# Patient Record
Sex: Female | Born: 1985 | Marital: Single | Smoking: Never smoker
Health system: Southern US, Community
[De-identification: ages and names within clinical notes are randomized; demographics above are authoritative.]

## PROBLEM LIST (undated history)

## (undated) DIAGNOSIS — F329 Major depressive disorder, single episode, unspecified: Secondary | ICD-10-CM

## (undated) DIAGNOSIS — F32A Depression, unspecified: Secondary | ICD-10-CM

## (undated) DIAGNOSIS — F419 Anxiety disorder, unspecified: Secondary | ICD-10-CM

## (undated) DIAGNOSIS — I10 Essential (primary) hypertension: Secondary | ICD-10-CM

## (undated) HISTORY — PX: NO PAST SURGERIES: SHX2092

---
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
## 2007-12-06 ENCOUNTER — Emergency Department (HOSPITAL_COMMUNITY): Admission: EM | Admit: 2007-12-06 | Discharge: 2007-12-06 | Payer: Self-pay | Admitting: Emergency Medicine

## 2008-06-05 ENCOUNTER — Emergency Department (HOSPITAL_COMMUNITY): Admission: EM | Admit: 2008-06-05 | Discharge: 2008-06-05 | Payer: Self-pay | Admitting: Emergency Medicine

## 2008-07-02 ENCOUNTER — Emergency Department (HOSPITAL_COMMUNITY): Admission: EM | Admit: 2008-07-02 | Discharge: 2008-07-02 | Payer: Self-pay | Admitting: Emergency Medicine

## 2009-06-03 ENCOUNTER — Inpatient Hospital Stay (HOSPITAL_COMMUNITY): Admission: AD | Admit: 2009-06-03 | Discharge: 2009-06-03 | Payer: Self-pay | Admitting: Obstetrics & Gynecology

## 2009-06-03 ENCOUNTER — Ambulatory Visit: Payer: Self-pay | Admitting: Advanced Practice Midwife

## 2009-06-27 ENCOUNTER — Inpatient Hospital Stay (HOSPITAL_COMMUNITY): Admission: AD | Admit: 2009-06-27 | Discharge: 2009-06-29 | Payer: Self-pay | Admitting: Obstetrics & Gynecology

## 2009-06-27 ENCOUNTER — Ambulatory Visit: Payer: Self-pay | Admitting: Obstetrics and Gynecology

## 2010-05-23 LAB — CBC
HCT: 31.8 % — ABNORMAL LOW (ref 36.0–46.0)
HCT: 37 % (ref 36.0–46.0)
Hemoglobin: 10.6 g/dL — ABNORMAL LOW (ref 12.0–15.0)
Hemoglobin: 12.4 g/dL (ref 12.0–15.0)
MCHC: 33.7 g/dL (ref 30.0–36.0)
MCV: 98.6 fL (ref 78.0–100.0)
Platelets: 175 10*3/uL (ref 150–400)
Platelets: 199 10*3/uL (ref 150–400)
RDW: 13.5 % (ref 11.5–15.5)
WBC: 18.6 10*3/uL — ABNORMAL HIGH (ref 4.0–10.5)

## 2010-05-23 LAB — RAPID URINE DRUG SCREEN, HOSP PERFORMED
Amphetamines: NOT DETECTED
Barbiturates: NOT DETECTED
Cocaine: NOT DETECTED
Opiates: NOT DETECTED
Tetrahydrocannabinol: POSITIVE — AB

## 2010-05-23 LAB — RPR: RPR Ser Ql: NONREACTIVE

## 2010-05-24 LAB — URINALYSIS, ROUTINE W REFLEX MICROSCOPIC
Bilirubin Urine: NEGATIVE
Hgb urine dipstick: NEGATIVE
Specific Gravity, Urine: 1.025 (ref 1.005–1.030)
pH: 6 (ref 5.0–8.0)

## 2010-06-14 LAB — POCT I-STAT, CHEM 8
Calcium, Ion: 1.2 mmol/L (ref 1.12–1.32)
Creatinine, Ser: 1.2 mg/dL (ref 0.4–1.2)
Potassium: 3.1 mEq/L — ABNORMAL LOW (ref 3.5–5.1)
Sodium: 144 mEq/L (ref 135–145)
TCO2: 21 mmol/L (ref 0–100)

## 2010-06-14 LAB — RAPID URINE DRUG SCREEN, HOSP PERFORMED: Amphetamines: NOT DETECTED

## 2012-07-23 ENCOUNTER — Encounter (HOSPITAL_COMMUNITY): Payer: Self-pay | Admitting: Emergency Medicine

## 2012-07-23 ENCOUNTER — Encounter (HOSPITAL_COMMUNITY): Payer: Self-pay | Admitting: Behavioral Health

## 2012-07-23 ENCOUNTER — Emergency Department (HOSPITAL_COMMUNITY)
Admission: EM | Admit: 2012-07-23 | Discharge: 2012-07-23 | Disposition: A | Discharge status: Other Acute Inpatient Hospital | Payer: MEDICAID | Attending: Emergency Medicine | Admitting: Emergency Medicine

## 2012-07-23 ENCOUNTER — Inpatient Hospital Stay (HOSPITAL_COMMUNITY)
Admission: AD | Admit: 2012-07-23 | Discharge: 2012-08-06 | DRG: 885 | Disposition: A | Discharge status: Home / Self Care | Payer: Federal, State, Local not specified - Other | Source: Intra-hospital | Attending: Psychiatry | Admitting: Psychiatry

## 2012-07-23 DIAGNOSIS — Z8659 Personal history of other mental and behavioral disorders: Secondary | ICD-10-CM | POA: Insufficient documentation

## 2012-07-23 DIAGNOSIS — F29 Unspecified psychosis not due to a substance or known physiological condition: Secondary | ICD-10-CM

## 2012-07-23 DIAGNOSIS — IMO0002 Reserved for concepts with insufficient information to code with codable children: Secondary | ICD-10-CM | POA: Insufficient documentation

## 2012-07-23 DIAGNOSIS — F250 Schizoaffective disorder, bipolar type: Secondary | ICD-10-CM

## 2012-07-23 DIAGNOSIS — G4700 Insomnia, unspecified: Secondary | ICD-10-CM | POA: Insufficient documentation

## 2012-07-23 DIAGNOSIS — F259 Schizoaffective disorder, unspecified: Principal | ICD-10-CM | POA: Diagnosis present

## 2012-07-23 DIAGNOSIS — F1994 Other psychoactive substance use, unspecified with psychoactive substance-induced mood disorder: Secondary | ICD-10-CM

## 2012-07-23 DIAGNOSIS — F1210 Cannabis abuse, uncomplicated: Secondary | ICD-10-CM | POA: Diagnosis present

## 2012-07-23 DIAGNOSIS — Z79899 Other long term (current) drug therapy: Secondary | ICD-10-CM

## 2012-07-23 DIAGNOSIS — F419 Anxiety disorder, unspecified: Secondary | ICD-10-CM

## 2012-07-23 DIAGNOSIS — F319 Bipolar disorder, unspecified: Secondary | ICD-10-CM

## 2012-07-23 DIAGNOSIS — F411 Generalized anxiety disorder: Secondary | ICD-10-CM | POA: Insufficient documentation

## 2012-07-23 DIAGNOSIS — F4310 Post-traumatic stress disorder, unspecified: Secondary | ICD-10-CM | POA: Diagnosis present

## 2012-07-23 DIAGNOSIS — F22 Delusional disorders: Secondary | ICD-10-CM | POA: Insufficient documentation

## 2012-07-23 DIAGNOSIS — Z3202 Encounter for pregnancy test, result negative: Secondary | ICD-10-CM | POA: Insufficient documentation

## 2012-07-23 DIAGNOSIS — F3289 Other specified depressive episodes: Secondary | ICD-10-CM | POA: Insufficient documentation

## 2012-07-23 DIAGNOSIS — F329 Major depressive disorder, single episode, unspecified: Secondary | ICD-10-CM | POA: Insufficient documentation

## 2012-07-23 DIAGNOSIS — D702 Other drug-induced agranulocytosis: Secondary | ICD-10-CM

## 2012-07-23 LAB — CBC WITH DIFFERENTIAL/PLATELET
Basophils Absolute: 0 10*3/uL (ref 0.0–0.1)
Basophils Relative: 0 % (ref 0–1)
Hemoglobin: 13.3 g/dL (ref 12.0–15.0)
Lymphs Abs: 2.1 10*3/uL (ref 0.7–4.0)
MCH: 31.5 pg (ref 26.0–34.0)
MCHC: 35.6 g/dL (ref 30.0–36.0)
MCV: 88.6 fL (ref 78.0–100.0)
Monocytes Absolute: 0.7 10*3/uL (ref 0.1–1.0)
Neutro Abs: 7.6 10*3/uL (ref 1.7–7.7)
Neutrophils Relative %: 73 % (ref 43–77)
Platelets: 236 10*3/uL (ref 150–400)
RBC: 4.22 MIL/uL (ref 3.87–5.11)
RDW: 12.4 % (ref 11.5–15.5)
WBC: 10.4 10*3/uL (ref 4.0–10.5)

## 2012-07-23 LAB — POCT PREGNANCY, URINE: Preg Test, Ur: NEGATIVE

## 2012-07-23 LAB — POCT I-STAT, CHEM 8
BUN: 15 mg/dL (ref 6–23)
Calcium, Ion: 1.19 mmol/L (ref 1.12–1.23)
Potassium: 3 mEq/L — ABNORMAL LOW (ref 3.5–5.1)

## 2012-07-23 LAB — RAPID URINE DRUG SCREEN, HOSP PERFORMED
Amphetamines: NOT DETECTED
Opiates: NOT DETECTED
Tetrahydrocannabinol: POSITIVE — AB

## 2012-07-23 MED ORDER — LORAZEPAM 1 MG PO TABS
1.0000 mg | ORAL_TABLET | Freq: Three times a day (TID) | ORAL | Status: DC | PRN
  Administered 2012-07-23: 1 mg via ORAL
  Filled 2012-07-23: qty 1

## 2012-07-23 MED ORDER — ACETAMINOPHEN 325 MG PO TABS: 650.0000 mg | ORAL_TABLET | ORAL | Status: DC | PRN

## 2012-07-23 MED ORDER — MAGNESIUM HYDROXIDE 400 MG/5ML PO SUSP: 30.0000 mL | Freq: Every day | ORAL | Status: DC | PRN

## 2012-07-23 MED ORDER — TRAZODONE HCL 50 MG PO TABS
50.0000 mg | ORAL_TABLET | Freq: Every evening | ORAL | Status: DC | PRN
  Administered 2012-07-25: 50 mg via ORAL

## 2012-07-23 MED ORDER — POTASSIUM CHLORIDE CRYS ER 20 MEQ PO TBCR
40.0000 meq | EXTENDED_RELEASE_TABLET | Freq: Once | ORAL | Status: AC
  Administered 2012-07-23: 40 meq via ORAL
  Filled 2012-07-23: qty 2

## 2012-07-23 MED ORDER — ZIPRASIDONE HCL 20 MG PO CAPS
20.0000 mg | ORAL_CAPSULE | Freq: Two times a day (BID) | ORAL | Status: DC
  Administered 2012-07-23: 20 mg via ORAL
  Filled 2012-07-23: qty 1

## 2012-07-23 MED ORDER — NICOTINE 21 MG/24HR TD PT24: 21.0000 mg | MEDICATED_PATCH | Freq: Every day | TRANSDERMAL | Status: DC

## 2012-07-23 MED ORDER — ALUM & MAG HYDROXIDE-SIMETH 200-200-20 MG/5ML PO SUSP
30.0000 mL | ORAL | Status: DC | PRN
  Administered 2012-08-04: 30 mL via ORAL

## 2012-07-23 MED ORDER — ALUM & MAG HYDROXIDE-SIMETH 200-200-20 MG/5ML PO SUSP: 30.0000 mL | ORAL | Status: DC | PRN

## 2012-07-23 MED ORDER — POTASSIUM CHLORIDE CRYS ER 20 MEQ PO TBCR
20.0000 meq | EXTENDED_RELEASE_TABLET | Freq: Once | ORAL | Status: AC
  Administered 2012-07-23: 20 meq via ORAL
  Filled 2012-07-23: qty 1

## 2012-07-23 MED ORDER — ACETAMINOPHEN 325 MG PO TABS
650.0000 mg | ORAL_TABLET | Freq: Four times a day (QID) | ORAL | Status: DC | PRN
  Administered 2012-07-27: 650 mg via ORAL

## 2012-07-23 NOTE — Progress Notes (Signed)
Admission note: Per pt, lately she have been feeling weird ever since Mother's Day. She do not know what's going on with her. 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 Reports  a decrease in sleep d/t work, school and parenthood. Pt denies SI but endorse VH. Per pt, when she looks at her son, sometimes he appear to her as a voo-doo doll and that freaks her out. Pt is worried about her son and feels like something is wrong with him. Pt asked, "have my son ever been in a hosp like this". Writer explain to pt that we do not admit children that age here at [facility]. Pt felt relieved.  She denies drinking alcohol but admits to drinking occassionally  in the past. She admits to smoking marijuana three times a day but is trying to stop. Reports last time she used marijuana was a month ago. Pt asked if she could get a prescription for marijuana and reassured writer that she would use it in the privacy of her own home.   Pt assessed, searched and explained the policy here at [facility].

## 2012-07-23 NOTE — BHH Counselor (Signed)
Pt's mother, Alvino Chapel, contacted Pod C after learning about pt's whereabouts.  She stated that she was seen by Sharen Hint at [facility] [place] last week for an evaluation after decompensating mentally over several weeks.  Pt's mother is even concerned that she may have been giving the baby unneeded medications for phantom physical complaints, such as Asthma and Impaction.  Pt's mother advised ED staff that pt has a history of mental illness and has had increasing A/V hallucinations, which she is also responding to in the ED.  Pt has been accepted for inpatient treatment to Dr. Daleen Bo.  Dr. Rhunette Croft and nursing staff notified and support paperwork completed and faxed to [facility].  Pt will be transported by [employer] as she is involuntary.  Admission checklist initiated.

## 2012-07-23 NOTE — ED Notes (Signed)
Report received from [place], [place] in Peds- pt placed into blue scrubs- all clothing removed, all jewelry except lip and nose ring removed. Patient attempted to remove bottom lip ring and nose ring, without success.

## 2012-07-23 NOTE — ED Notes (Signed)
Report called to Clydie Braun RN and was instructed to move the patient to POD C.

## 2012-07-23 NOTE — ED Notes (Signed)
Monica Byrd, ACT Team,came to see the patient.

## 2012-07-23 NOTE — ED Notes (Signed)
Asking " Why did I come here? Where is my baby?" explained that she brought herself here.
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
## 2012-07-23 NOTE — ED Notes (Signed)
Spoke with mother Alvino Chapel  [phone] on phone-
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
## 2012-07-23 NOTE — ED Notes (Signed)
Pt is asleep at this time, son is at bedside, no signs of distress.

## 2012-07-23 NOTE — ED Notes (Signed)
Patient was checked and patient is sitting on the recliner. Asked if she needed anything and she stated that she is fine at present. Social worker is at the bedside.

## 2012-07-23 NOTE — ED Notes (Signed)
Patient is carrying her son and is noted to be walking towards the door. Security talked to the patient and instructed the patient to wait in the room. Patient complied but with hesitation.

## 2012-07-23 NOTE — Progress Notes (Signed)
Psychoeducational Group Note  Date:  07/23/2012 Time:  2000  Group Topic/Focus:  Wrap-Up Group:   The focus of this group is to help patients review their daily goal of treatment and discuss progress on daily workbooks.  Participation Level: Did Not Attend  Participation Quality:  Not Applicable  Affect:  Not Applicable  Cognitive:  Not Applicable  Insight:  Not Applicable  Engagement in Group: Not Applicable  Additional Comments:  Pt did not attend  Christ Kick 07/23/2012, 10:23 PM
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
## 2012-07-23 NOTE — ED Notes (Signed)
Patient was walked to the POD C and was escorted by Security and EMT.

## 2012-07-23 NOTE — BH Assessment (Signed)
BHH Assessment Progress Note      Consulted with Shuvon Rankin NP re admission for this patient currently in the [facility] to [facility]. She has been accepted to room 505-2.

## 2012-07-23 NOTE — Tx Team (Signed)
Initial Interdisciplinary Treatment Plan  PATIENT STRENGTHS: (choose at least two) Ability for insight Motivation for treatment/growth  PATIENT STRESSORS: Educational concerns Occupational concerns Substance abuse   PROBLEM LIST: Problem List/Patient Goals Date to be addressed Date deferred Reason deferred Estimated date of resolution  Depression 07/23/12     Psychosis  07/23/12     SI 07/23/12                                          DISCHARGE CRITERIA:  Ability to meet basic life and health needs Adequate post-discharge living arrangements Improved stabilization in mood, thinking, and/or behavior Verbal commitment to aftercare and medication compliance  PRELIMINARY DISCHARGE PLAN: Attend aftercare/continuing care group Attend PHP/IOP  PATIENT/FAMIILY INVOLVEMENT: This treatment plan has been presented to and reviewed with the patient, Monica Byrd, and/or family member.  The patient and family have been given the opportunity to ask questions and make suggestions.  [name] 07/23/2012, 7:21 PM

## 2012-07-23 NOTE — ED Notes (Signed)
Patient is pacing with her child on her arms, instructed to go back to her room and the counselor will come and see the patient.

## 2012-07-23 NOTE — Progress Notes (Signed)
Pt is new to the unit this evening.  She asks about why she is here.  Spent time with pt talking about her experience at the ED and the circumstances around her admission.  Pt did not seem convinced at this time, but she is calm and cooperative.  She is in her room and asks that the door be kept shut.  Informed pt that she would be checked on q15 minutes for her safety and that she would have a medication for sleep available.  Pt voiced understanding.  Pt was encouraged to make her needs known to staff.  Support and encouragement offered.  Safety maintained with q15 minute checks.

## 2012-07-23 NOTE — ED Notes (Signed)
Crying -- states "just want it all to stop!"

## 2012-07-23 NOTE — ED Notes (Signed)
CPS and Social Worker talked to the patient that she is staying in the hospital and that her son is going to discharged under the care of her grandmother.

## 2012-07-23 NOTE — ED Notes (Signed)
Pt's grandparents's phone number is [phone]--Louise and Edwinna Areola.
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
## 2012-07-24 DIAGNOSIS — F4310 Post-traumatic stress disorder, unspecified: Secondary | ICD-10-CM

## 2012-07-24 DIAGNOSIS — F1910 Other psychoactive substance abuse, uncomplicated: Secondary | ICD-10-CM

## 2012-07-24 DIAGNOSIS — F29 Unspecified psychosis not due to a substance or known physiological condition: Secondary | ICD-10-CM

## 2012-07-24 MED ORDER — BENZTROPINE MESYLATE 1 MG PO TABS
1.0000 mg | ORAL_TABLET | Freq: Every day | ORAL | Status: DC
  Administered 2012-07-24: 1 mg via ORAL
  Filled 2012-07-24 (×2): qty 1

## 2012-07-24 MED ORDER — POTASSIUM CHLORIDE CRYS ER 10 MEQ PO TBCR
10.0000 meq | EXTENDED_RELEASE_TABLET | Freq: Every day | ORAL | Status: DC
  Administered 2012-07-24 – 2012-08-06 (×14): 10 meq via ORAL
  Filled 2012-07-24 (×16): qty 1

## 2012-07-24 MED ORDER — RISPERIDONE 0.5 MG PO TBDP
ORAL_TABLET | ORAL | Status: AC
  Filled 2012-07-24: qty 1

## 2012-07-24 MED ORDER — RISPERIDONE 0.5 MG PO TBDP
0.5000 mg | ORAL_TABLET | Freq: Two times a day (BID) | ORAL | Status: DC
  Administered 2012-07-24: 0.5 mg via ORAL
  Filled 2012-07-24 (×3): qty 1

## 2012-07-24 MED ORDER — RISPERIDONE 0.5 MG PO TBDP
0.5000 mg | ORAL_TABLET | Freq: Two times a day (BID) | ORAL | Status: DC
  Administered 2012-07-24 – 2012-07-25 (×2): 0.5 mg via ORAL
  Filled 2012-07-24 (×3): qty 1
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
## 2012-07-24 NOTE — Clinical Social Work Note (Signed)
[facility] Group Notes:  (Counselor/Nursing/MHT/Case Management/Adjunct)  01/17/2012 2:20 PM  Type of Therapy:  Group Therapy  Participation Level:  None  Participation Quality:  Resistant  Affect:  Tearful  Cognitive:  unknown  Insight:  Limited  Engagement in Group:  None  Engagement in Therapy:  Limited  Modes of Intervention:  Discussion, Exploration and Socialization  Summary of Progress/Problems: .balance: The topic for group was balance in life.  Pt participated in the discussion about when their life was in balance and out of balance and how this feels.  Pt discussed ways to get back in balance and short term goals they can work on to get where they want to be. Noble did not speak.  However, she became tearful several times when others were speaking.  She declined to let us know what was going on when offered the opportunity.  She left early.   Daryel Gerald B 01/17/2012, 2:20 PM
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
## 2012-07-24 NOTE — Progress Notes (Signed)
Psychoeducational Group Note  Date:  07/24/2012 Time:  2000   Group Topic/Focus:  Karaoke  Participation Level: Did Not Attend  Participation Quality:  Not Applicable  Affect:  Not Applicable  Cognitive:  Not Applicable  Insight:  Not Applicable  Engagement in Group: Not Applicable  Additional Comments:     Flonnie Hailstone 07/24/2012, 11:28 PM

## 2012-07-24 NOTE — Progress Notes (Signed)
Patient ID: Monica Byrd, female   DOB: 1985-04-17, 27 y.o.   MRN: [id number]  D: Pt endorses AVH and contracts for safety. Pt denies SI/HI. Pt is pleasant and cooperative. Pt tearful upon approach, but forwards little information. Pt kneeling in floor sad, tearful, and very emotional.  Pt states she has nowhere to go and she has done some things in the past that "wern't right".   A: Pt was offered support and encouragement. Pt was given scheduled medications. Pt was encourage to attend groups. Q 15 minute checks were done for safety. Pt given second dose of 0.5 Risperdal to help with the voices. Therapeutic communication offered, but pt seems to be exhibiting defense mechanism- blocking upon most attempts to help pt.   R:Pt did not go to [employer]. Pt is taking medication.Pt receptive to treatment and safety maintained on unit.

## 2012-07-24 NOTE — Progress Notes (Signed)
Psychoeducational Group Note  Date:  07/24/2012 Time:  16109  Group Topic/Focus:  Rediscovering Joy:   The focus of this group is to explore various ways to relieve stress in a positive manner.  Participation Level: Did Not Attend  Participation Quality:  Not Applicable  Affect:  Not Applicable  Cognitive:  Not Applicable  Insight:  Not Applicable  Engagement in Group: Not Applicable  Additional Comments:  Pt refused to attend group this morning.  Domenick Quebedeaux E 07/24/2012, 11:04 AM

## 2012-07-24 NOTE — Progress Notes (Signed)
D:  Patient very quiet and looks scared much of the time.  Admits to auditory and visual hallucinations.  States she wants her head to feel normal again.  Rates depression at 8, hopelessness at 2, and anxiety at 8 or 9.  States she has never really dealt with the trauma of her past but did not elaborate.  Started on risperidone today.  She put the pill in her mouth and tried to spit it out, but did take it with some coaxing.  She is on the M tabs.  She admits to fleeting thoughts of suicide, but states she would not act on these thoughts.  Conversations are very scattered and difficult to follow.  Tearful at times.  She sometimes looks to be talking to someone else in the room.  She has not attended many groups, but she is not able to fully engage at this time.  A:  Medications given as per schedule.  Spoke with patient this morning to reinforce what to expect in her stay here at [facility].  Reassured her that she is safe and any medications we gave her would be safe for her to take.  Allowed her to look at the medication packages per her request.  Encouraged her to come out of her room some, but did not push her. R:  Pleasant and cooperative.  Interacting some with staff, minimally with peers.  She looks scared much of the time.  She is nervous about taking medications, but reluctantly agrees.  Needs to be watched carefully when administering medicines.  Not fully trusting staff at this time.

## 2012-07-24 NOTE — [facility] Group Notes (Signed)
[facility] LCSW Aftercare Discharge Planning Group Note   07/24/2012 7:42 AM  Participation Quality:  Engaged  Mood/Affect:  Blunted  Depression Rating:  denies  Anxiety Rating:  7  Thoughts of Suicide:  No Will you contract for safety?   NA  Current AVH:  presents as guarded, suspicious  Plan for Discharge/Comments:  A states she is here to get help with "PTSD, not focusing on what is going on around me, and getting angry at people around me.  Very vague answers and unable to clarify.  Presents as guarded and suspicious.  Mood neutral, flat affect.  States she lives with grandparents.  No current outpt providers. Not working or in school.  Transportation Means: family  Supports: family  [place], Baldo Daub

## 2012-07-24 NOTE — [facility] Suicide Risk Assessment (Signed)
Suicide Risk Assessment  Admission Assessment     Nursing information obtained from:  Patient Demographic factors:  Adolescent or young adult;Low socioeconomic status Current Mental Status:  NA Loss Factors:  Loss of significant relationship Historical Factors:  Family history of mental illness or substance abuse Risk Reduction Factors:  Responsible for children under 25 years of age;Sense of responsibility to family;Employed;Positive social support  CLINICAL FACTORS:   Severe Anxiety and/or Agitation Alcohol/Substance Abuse/Dependencies Currently Psychotic Previous Psychiatric Diagnoses and Treatments  COGNITIVE FEATURES THAT CONTRIBUTE TO RISK:  Closed-mindedness Polarized thinking    SUICIDE RISK:   Mild:  Suicidal ideation of limited frequency, intensity, duration, and specificity.  There are no identifiable plans, no associated intent, mild dysphoria and related symptoms, good self-control (both objective and subjective assessment), few other risk factors, and identifiable protective factors, including available and accessible social support.  PLAN OF CARE:1. Admit for crisis management and stabilization. 2. Medication management to reduce current symptoms to base line and improve the  patient's overall level of functioning 3. Treat health problems as indicated. 4. Develop treatment plan to decrease risk of relapse upon discharge and the need for  readmission. 5. Psycho-social education regarding relapse prevention and self care. 6. Health care follow up as needed for medical problems. 7. Restart home medications where appropriate.   I certify that inpatient services furnished can reasonably be expected to improve the patient's condition.  Lydiah Pong,MD 07/24/2012, 11:14 AM

## 2012-07-25 DIAGNOSIS — F1210 Cannabis abuse, uncomplicated: Secondary | ICD-10-CM

## 2012-07-25 DIAGNOSIS — F1999 Other psychoactive substance use, unspecified with unspecified psychoactive substance-induced disorder: Secondary | ICD-10-CM

## 2012-07-25 MED ORDER — BENZTROPINE MESYLATE 0.5 MG PO TABS
0.5000 mg | ORAL_TABLET | Freq: Every day | ORAL | Status: DC
  Administered 2012-07-25 – 2012-07-27 (×3): 0.5 mg via ORAL
  Filled 2012-07-25 (×4): qty 1

## 2012-07-25 MED ORDER — OLANZAPINE 10 MG PO TBDP
10.0000 mg | ORAL_TABLET | Freq: Three times a day (TID) | ORAL | Status: DC | PRN
  Administered 2012-07-25 – 2012-07-28 (×4): 10 mg via ORAL
  Filled 2012-07-25 (×4): qty 1

## 2012-07-25 MED ORDER — ADULT MULTIVITAMIN W/MINERALS CH
1.0000 | ORAL_TABLET | Freq: Every day | ORAL | Status: DC
  Administered 2012-07-26 – 2012-08-06 (×12): 1 via ORAL
  Filled 2012-07-25 (×14): qty 1

## 2012-07-25 MED ORDER — FLUOXETINE HCL 10 MG PO CAPS
10.0000 mg | ORAL_CAPSULE | Freq: Every day | ORAL | Status: DC
  Administered 2012-07-25 – 2012-07-28 (×4): 10 mg via ORAL
  Filled 2012-07-25 (×6): qty 1

## 2012-07-25 MED ORDER — ENSURE COMPLETE PO LIQD
237.0000 mL | Freq: Two times a day (BID) | ORAL | Status: DC
  Administered 2012-07-26 – 2012-08-06 (×16): 237 mL via ORAL

## 2012-07-25 MED ORDER — RISPERIDONE 1 MG PO TBDP
1.0000 mg | ORAL_TABLET | Freq: Every day | ORAL | Status: DC
  Administered 2012-07-26: 1 mg via ORAL
  Filled 2012-07-25 (×2): qty 1

## 2012-07-25 NOTE — Progress Notes (Signed)
D: Patient denies HI and visual hallucinations. The patient reports having auditory hallucinations but refuses to say what she is hearing. The patient started crying when asked what the voices were saying. The patient is positive for SI but verbally agrees to contract for safety. The patient has an anxious mood and affect. The patient reports "being afraid here" and that she is "anxious someone is here" to "get her."   A: Patient given emotional support from RN. Patient encouraged to come to staff with concerns and/or questions. Patient's medication routine continued. Patient's orders and plan of care reviewed. MD informed of patient's fear and anxiety and prn medication ordered. PRN zyprexa given to patient for agitation/anxiety.  R: Patient remains cooperative. Patient reports feeling "a little better." Will continue to monitor patient q15 minutes for safety.

## 2012-07-25 NOTE — Progress Notes (Signed)
Adult Psychoeducational Group Note  Date: 07/25/2012  Time: 12:39 PM  Group Topic/Focus:  Relapse Prevention Planning: The focus of this group is to define relapse and discuss the need for planning to combat relapse.  Participation Level: Did Not Attend  Participation Quality:  Affect:  Cognitive:  Insight:  Engagement in Group:  Modes of Intervention:  Additional Comments: Pt refused to attend, pt was in the  Room. Pt appears to be a little paranoid. Pt keep saying " have a stalker" and tried to put a chair behind the door. Isla Pence M  07/25/2012, 12:39 PM

## 2012-07-25 NOTE — Progress Notes (Signed)
Adult Psychoeducational Group Note  Date:  07/25/2012 Time:  9:12 PM  Group Topic/Focus:  Wrap-Up Group:   The focus of this group is to help patients review their daily goal of treatment and discuss progress on daily workbooks.  Participation Level:  Minimal  Participation Quality:  Resistant  Affect:  Flat  Cognitive:  Confused  Insight: Lacking  Engagement in Group:  Lacking  Modes of Intervention:  Discussion  Additional Comments:  Pt stated that she had an ok day.  Kaleen Odea R 07/25/2012, 9:12 PM

## 2012-07-25 NOTE — Progress Notes (Signed)
Patient ID: Monica Byrd, female   DOB: April 18, 1985, 27 y.o.   MRN: [id number] D: Patient denies SI/HI/AVH and pain. Pt mood/affect is anxious. Pt stated she believes things in her past is hunting her but she is trying to leave it behind and get her life together.  Pt endorses passive suicidal ideation but contracts for safety. Pt attended evening wrap up group and Interacted appropriately with peers. Pt denies any needs or concerns.  Cooperative with assessment. No acute distressed noted at this time.   A: Met with pt 1:1. Medications administered as prescribed. Writer encouraged pt to discuss feelings. Pt encouraged to come to staff with any question or concerns. 15 minutes checks for safety.  R: Patient remains safe. She is complaint with medications and denies any adverse reaction. Continue current POC.

## 2012-07-25 NOTE — Clinical Social Work Note (Signed)
Unable to do PSA today.  Pt continues extremely paranoid [believing she is bing monitored] and responding to internal stimuli.

## 2012-07-25 NOTE — Progress Notes (Signed)
NUTRITION ASSESSMENT  Pt identified as at risk on the Malnutrition Screen Tool  INTERVENTION: 1. Educated patient on the importance of nutrition and encouraged intake of food and beverages. 2. Discussed weight goals. 3. Supplements: Ensure Complete po BID, each supplement provides 350 kcal and 13 grams of protein. 4.  MVI daily  NUTRITION DIAGNOSIS: Unintentional weight loss related to sub-optimal intake as evidenced by pt report.   Goal: Pt to meet >/= 90% of their estimated nutrition needs.  Monitor:  PO intake  Assessment:  Patient answered questions minimally.  Does not know UBW or usual intake.  27 y.o. female  Height: Ht Readings from Last 1 Encounters:  07/23/12 5\' 3"  (1.6 m)    Weight: Wt Readings from Last 1 Encounters:  07/23/12 138 lb (62.596 kg)    Weight Hx: Wt Readings from Last 10 Encounters:  07/23/12 138 lb (62.596 kg)    BMI:  Body mass index is 24.45 kg/(m^2). Pt meets criteria for WNL based on current BMI.  Estimated Nutritional Needs: Kcal: 25-30 kcal/kg Protein: > 1 gram protein/kg Fluid: 1 ml/kcal  Diet Order: General Pt is also offered choice of unit snacks mid-morning and mid-afternoon.  Pt is eating as desired.   Lab results and medications reviewed.   Oran Rein, RD, LDN Clinical Inpatient Dietitian Pager:  [phone] Weekend and after hours pager:  [phone]
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
## 2012-07-26 NOTE — Progress Notes (Signed)
Patient ID: Monica Byrd, female   DOB: 12-Mar-1985, 27 y.o.   MRN: [id number] Psychoeducational Group Note  Date:  07/26/2012 Time:1000am  Group Topic/Focus:  Identifying Needs:   The focus of this group is to help patients identify their personal needs that have been historically problematic and identify healthy behaviors to address their needs.  Participation Level:  Minimal  Participation Quality:  Drowsy  Affect:  Anxious and Lethargic  Cognitive:  Confused  Insight:  Poor  Engagement in Group:  Poor  Additional Comments:  Inventory and Psychoeducational group   Valente David 07/26/2012,10:44 AM

## 2012-07-26 NOTE — Progress Notes (Signed)
Patient ID: Monica Byrd, female   DOB: Feb 07, 1986, 27 y.o.   MRN: [id number] 07-26-12 @ 1713 nursing shift note: D: this pt remains isolative, paranoid and has crying spells. He mother has called to inquire about her. mother also brought in hair products that will be kept in the 300-400 hall med room. pt has passive SI, but is able to contract with staff A: staff has consoled this pt throughout the day. She did go to recreation and has had meals in her room due to the paranoia. She had concerns about pregnancy and her preg test is negative.this was communicated to her. She has been administered Zyprexa prn x2 today. R: on her inventory sheet she wrote: she requested sleep medication last night, appetite improving, energy hyper, attention poor with her depression at 10 and her hopelessness at 8.  RN will monitor and Q 15 min ck's continue.
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
## 2012-07-26 NOTE — Progress Notes (Signed)
Patient ID: Monica Byrd, female   DOB: October 07, 1985, 27 y.o.   MRN: [id number]  D: Pt endorses AH, but states they are better. Pt states " everything feeling better, I don't really remember the last two days". Pt continues to be paranoid.  Pt denies SI/HI/VH. Pt is pleasant and cooperative. Pt went outside today.  Pt overall mood and communication today has improved. Pt was asking about being pregnant, but was explained by the writer that the test results (pregnancy) were negative, and pt seemed to understand.  A: Pt was offered support and encouragement. Pt was given scheduled medications. Pt was encourage to attend groups. Q 15 minute checks were done for safety.   R:Pt attends groups and interacts well with peers and staff. Pt is taking medication. Pt has no complaints at this time.Pt receptive to treatment and safety maintained on unit.

## 2012-07-26 NOTE — Progress Notes (Signed)
The focus of this group is to help patients review their daily goal of treatment and discuss progress on daily workbooks. Pt attended the evening group session and responded to all discussion prompts. Pt reported having a good day and feeling noticeably better than she did yesterday. Pt reported not feeling "100% better yet," but still feeling an improvement. Pt stated she was ready to be discharged and felt "this would all just work itself out." Pt stated that the highlight of her day was being able to spend time with he mother who came to visit. Pt's affect was appropriate and she appeared engaged in the group.

## 2012-07-26 NOTE — [facility] Group Notes (Signed)
[facility] Group Notes:  (Clinical Social Work)  07/26/2012  11:15-11:45AM  Summary of Progress/Problems:   The main focus of today's process group was for the patient to identify ways in which they have in the past sabotaged their own recovery and reasons they may have done this/what they received from doing it.  We then worked to identify a specific plan to avoid doing this when discharged from the hospital for this admission.  The patient was late to group, had only about 10 minutes toward end of group.  She expressed that she self-sabotages by letting the past haunt her.  She wants to eliminate the voices, and states she was self-medicating with THC, was not on meds prior to coming to the hospital.  Type of Therapy:  Group Therapy - Process  Participation Level:  Minimal  Participation Quality:  Inattentive  Affect:  Flat  Cognitive:  Appropriate  Insight:  Improving  Engagement in Therapy:  Developing/Improving  Modes of Intervention:  Clarification, Education, Exploration, Discussion  Ambrose Mantle, LCSW 07/26/2012, 12:29 PM

## 2012-07-27 MED ORDER — RISPERIDONE 2 MG PO TBDP
2.0000 mg | ORAL_TABLET | Freq: Every day | ORAL | Status: DC
  Administered 2012-07-27: 2 mg via ORAL
  Filled 2012-07-27 (×2): qty 1

## 2012-07-27 NOTE — Progress Notes (Signed)
Patient ID: Monica Byrd, female   DOB: [birth year], 27 y.o.   MRN: [id number] Psychoeducational Group Note  Date:  07/27/2012 Time:  1000am  Group Topic/Focus:  Making Healthy Choices:   The focus of this group is to help patients identify negative/unhealthy choices they were using prior to admission and identify positive/healthier coping strategies to replace them upon discharge.  Participation Level:  Did Not Attend  Participation Quality:    Affect: Cognitive: Insight: Engagement in Group:   Additional Comments:  Psychoeducational group -unable to participate  Monica Byrd 07/27/2012,10:51 AM

## 2012-07-27 NOTE — [facility] Group Notes (Signed)
[facility] Group Notes:  (Clinical Social Work)  07/27/2012   11:15-11:45AM  Summary of Progress/Problems:  The main focus of today's process group was to listen to a variety of genres of music and to identify that different types of music provoke different responses.  The patient then was able to identify personally what was soothing for them, as well as energizing.  Handouts were used to record feelings evoked, as well as how patient can personally use this knowledge in sleep habits, with depression, and with other symptoms.  The patient expressed tearfulness several times while listening to music, and ultimately left the room with a migraine.  Type of Therapy:  Music Therapy   Participation Level:  Minimal  Participation Quality:  Inattentive  Affect:  Depressed and Flat  Cognitive:  Confused  Insight:  Limited  Engagement in Therapy:  Limited  Modes of Intervention:   Activity, Exploration  Ambrose Mantle, LCSW 07/27/2012, 12:41 PM

## 2012-07-27 NOTE — Progress Notes (Signed)
Writer spoke with patient 1:1 and she reports to having had a good day. Patient reports that she continues to have auditory and visual hallucinations. Patient says that she has a struggle in her mind with good and evil thoughts. She feels that sometimes people can or are trying to read her mind. Patient was informed of her medications scheduled and she is hesitant about taking them reporting that she feel that she does not need medication. Writer encouraged her to think about this and we would see what she decided later on. Patient reports passive si but did not elaborate. Patient has poor eye contact and reported to writer that she feels like one of the patients is stalking her, when Clinical research associate asked if there was one particular patient she seemed confused in her thoughts and said never mind. Support and encouragement offered, safety maintained on unit with 15 min checks, will continue to monitor.

## 2012-07-27 NOTE — Progress Notes (Signed)
[facility] MD Progress Note  07/27/2012 9:39 AM Monica Byrd  MRN:  [id number] Subjective: "I am seeing things and hearing things.  I am very paranoid around people ". Objective: Patient remains very paranoid guarded and withdrawn.  She endorse auditory or visual hallucination.  She does not feel comfortable around people.  Excessive encouragement for medication compliance.  She need a lot of reassurance to attend the groups.  She denies any side effects of medication .  She continued to endorse nightmares or flashbacks vivid dreams .  She continued to believe that people are spying on her.  In the conversation she was noted ready to drawn isolated with close eyes.  It appears that she was responding to internal stimuli.  She denies any active or passive suicidal thoughts.  She continues to obsess about her past.    Diagnosis:  Axis I: PTSD                               Cannabis use disorder. Drug induced psychosis.   ADL's:  Intact  Sleep: Poor  Appetite:  Fair  Suicidal Ideation:  Plan:  denies Intent:  denies Means:  denies Homicidal Ideation:  Plan:  denies Intent:  denies Means:  denies AEB (as evidenced by):  Psychiatric Specialty Exam: Review of Systems  Constitutional: Negative.   HENT: Negative.   Eyes: Negative.   Respiratory: Negative.   Cardiovascular: Negative.   Gastrointestinal: Negative.   Genitourinary: Negative.   Musculoskeletal: Negative.   Skin: Negative.   Neurological: Negative.   Endo/Heme/Allergies: Negative.   Psychiatric/Behavioral: Positive for hallucinations and substance abuse. The patient is nervous/anxious and has insomnia.        Guarded and paranoid.    Blood pressure 103/72, pulse 108, temperature 97.6 F (36.4 C), temperature source Oral, resp. rate 16, height 5\' 3"  (1.6 m), weight 62.596 kg (138 lb), last menstrual period 07/15/2012.Body mass index is 24.45 kg/(m^2).  General Appearance: Fairly Groomed  Patent attorney::  Poor  Speech:   Slow  Volume:  Decreased  Mood:  Anxious and Depressed  Affect:  Blunt  Thought Process:  Disorganized  Orientation:  Full (Time, Place, and Person)  Thought Content:  Hallucinations: Auditory and Paranoid Ideation  Suicidal Thoughts:  No  Homicidal Thoughts:  No  Memory:  Immediate;   Fair Recent;   Fair Remote;   Poor  Judgement:  Poor  Insight:  Lacking  Psychomotor Activity:  Decreased  Concentration:  Poor  Recall:  Poor  Akathisia:  No  Handed:  Right  AIMS (if indicated):     Assets:  Desire for Improvement Housing Others:  employed  Sleep:  Number of Hours: 6.75   Current Medications: Current Facility-Administered Medications  Medication Dose Route Frequency Provider Last Rate Last Dose  . acetaminophen (TYLENOL) tablet 650 mg  650 mg Oral Q6H PRN Shuvon Rankin, NP      . alum & mag hydroxide-simeth (MAALOX/MYLANTA) 200-200-20 MG/5ML suspension 30 mL  30 mL Oral Q4H PRN Shuvon Rankin, NP      . benztropine (COGENTIN) tablet 0.5 mg  0.5 mg Oral QHS Mojeed Akintayo   0.5 mg at 07/26/12 2139  . feeding supplement (ENSURE COMPLETE) liquid 237 mL  237 mL Oral BID BM Jeoffrey Massed, RD   237 mL at 07/26/12 1530  . FLUoxetine (PROZAC) capsule 10 mg  10 mg Oral Daily Mojeed Akintayo   10 mg at 07/27/12  1610  . magnesium hydroxide (MILK OF MAGNESIA) suspension 30 mL  30 mL Oral Daily PRN Shuvon Rankin, NP      . multivitamin with minerals tablet 1 tablet  1 tablet Oral Daily Jeoffrey Massed, RD   1 tablet at 07/27/12 [phone]  . OLANZapine zydis (ZYPREXA) disintegrating tablet 10 mg  10 mg Oral Q8H PRN Mojeed Akintayo   10 mg at 07/26/12 1722  . potassium chloride (K-DUR,KLOR-CON) CR tablet 10 mEq  10 mEq Oral Daily Verne Spurr, PA-C   10 mEq at 07/27/12 0839  . risperiDONE (RISPERDAL M-TABS) disintegrating tablet 1 mg  1 mg Oral QHS Mojeed Akintayo   1 mg at 07/26/12 2138  . traZODone (DESYREL) tablet 50 mg  50 mg Oral QHS PRN Shuvon Rankin, NP   50 mg at 07/25/12 0154    Lab  Results: No results found for this or any previous visit (from the past 48 hour(s)).  Physical Findings: AIMS: Facial and Oral Movements Muscles of Facial Expression: None, normal Lips and Perioral Area: None, normal Jaw: None, normal Tongue: None, normal,Extremity Movements Upper (arms, wrists, hands, fingers): None, normal Lower (legs, knees, ankles, toes): None, normal, Trunk Movements Neck, shoulders, hips: None, normal, Overall Severity Severity of abnormal movements (highest score from questions above): None, normal Incapacitation due to abnormal movements: None, normal Patient's awareness of abnormal movements (rate only patient's report): No Awareness, Dental Status Current problems with teeth and/or dentures?: No Does patient usually wear dentures?: No  CIWA:  CIWA-Ar Total: 0 COWS:     Treatment Plan Summary: Daily contact with patient to assess and evaluate symptoms and progress in treatment Medication management  Plan:1. Admit for crisis management and stabilization. 2. Medication management to reduce current symptoms to base line and improve the patient's overall level of functioning 3. Treat health problems as indicated. 4. Develop treatment plan to decrease risk of relapse upon discharge and the need for     readmission. 5. Psycho-social education regarding relapse prevention and self care. 6. Health care follow up as needed for medical problems. 7. increase Risperdal to 2 mg po daily at bedtime for delusions and psychosis 8 continue Prozac 10mg  po daily for depression/anxiety and PTSD symptoms.  Her medications were adjusted yesterday we will closely monitor response.   Medical Decision Making Problem Points:  Established problem, stable/improving (1), Review of last therapy session (1) and Review of psycho-social stressors (1) Data Points:  Order Aims Assessment (2) Review of medication regiment & side effects (2) Review of new medications or change in dosage  (2)  I certify that inpatient services furnished can reasonably be expected to improve the patient's condition.   Edrik Rundle T.,MD 07/27/2012, 9:39 AM
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
## 2012-07-27 NOTE — Progress Notes (Signed)
Patient ID: Monica Byrd, female   DOB: 1985-04-17, 27 y.o.   MRN: [id number] D: "I'm Ok; the headache's all gone."  A:  Pt. admitted to passive S/H/I this AM: when asked who she was having (H/I) thoughts about, Pt. stated, "everyone" and was tearful.  Pt.'s eyes were swollen before she came to the med. window to take her meds this AM.  Pt. C/o headache at 13:00 and was given Tylenol, with relief in an hour.  Pt. Continues to have a depressed mood and affect is congruent, but does not seem to be crying this PM. Pt. offers little information and says she "doesn't want to talk about it."   Pt. is up, ambulating in the hallway or watching TV in the DR, quietly.  R: Will continue to monitor for changes.

## 2012-07-28 DIAGNOSIS — F1994 Other psychoactive substance use, unspecified with psychoactive substance-induced mood disorder: Secondary | ICD-10-CM

## 2012-07-28 DIAGNOSIS — F4310 Post-traumatic stress disorder, unspecified: Secondary | ICD-10-CM

## 2012-07-28 DIAGNOSIS — F250 Schizoaffective disorder, bipolar type: Secondary | ICD-10-CM

## 2012-07-28 MED ORDER — BENZTROPINE MESYLATE 0.5 MG PO TABS
0.5000 mg | ORAL_TABLET | Freq: Two times a day (BID) | ORAL | Status: DC
  Administered 2012-07-28 – 2012-07-31 (×6): 0.5 mg via ORAL
  Filled 2012-07-28 (×8): qty 1

## 2012-07-28 MED ORDER — FLUOXETINE HCL 20 MG PO CAPS
20.0000 mg | ORAL_CAPSULE | Freq: Every day | ORAL | Status: DC
  Administered 2012-07-29 – 2012-08-06 (×9): 20 mg via ORAL
  Filled 2012-07-28 (×11): qty 1

## 2012-07-28 MED ORDER — HALOPERIDOL 2 MG PO TABS: 2.0000 mg | ORAL_TABLET | Freq: Two times a day (BID) | ORAL | Status: DC

## 2012-07-28 MED ORDER — LORAZEPAM 1 MG PO TABS
2.0000 mg | ORAL_TABLET | Freq: Once | ORAL | Status: AC
  Administered 2012-07-28: 2 mg via ORAL
  Filled 2012-07-28: qty 2

## 2012-07-28 MED ORDER — HALOPERIDOL 5 MG PO TABS
5.0000 mg | ORAL_TABLET | Freq: Two times a day (BID) | ORAL | Status: DC
  Administered 2012-07-28 – 2012-07-30 (×5): 5 mg via ORAL
  Filled 2012-07-28 (×8): qty 1

## 2012-07-28 NOTE — Progress Notes (Signed)
Pt has been asleep in her bed since the beginning of the shift at 1900.  Pt has been monitored for safety q15 minutes.  Eyes are closed.  Respirations are even/unlabored.  No distress observed.  Pt was started on a new medication this afternoon.  Safety maintained with q15 minute checks by staff.

## 2012-07-28 NOTE — ED Provider Notes (Signed)
Medical screening examination/treatment/procedure(s) were performed by non-physician practitioner and as supervising physician I was immediately available for consultation/collaboration.  Menashe Kafer, MD 07/28/12 0913 

## 2012-07-28 NOTE — Progress Notes (Signed)
Recreation Therapy Notes  Date: 05.26.2014  Time: 9:30am  Location: [address] Dayroom   Group Topic/Focus: Self Expression   Participation Level:  Active   Participation Quality:  Appropriate   Affect:  Euthymic to Agitated and Angry   Cognitive:  Oriented   Additional Comments: Activity: Me in 3; Explanation: Patients were asked to identify three characteristics they think represent them and asked to represent these characteristics in words or pictures. Patients were given the option of choosing a genre of music to listen to while completing the task requested. Patients collectively chose R&B music to listen to.   Patient participated in activity, however she tore her paper in half before LRT could see what she had created. Patient then became very agitated at the music choice the group had made. Patient requested Ma [place]ock, however LRT would not honor request due to concerns over inappropriate language or lyrics. Patient stated she did not like the song that was playing and she did not understand why she had to feel. LRT explained that music can sometimes remind [place] of events in life and sometimes the memories are not always happy. Patient then began tearing the paper she had been working on into many small pieces. Patient then scowled and stated something in a frustrated tone that was inaudible. Patient then threw her paper on the floor in the day room. Patient peer offered her suggestions and advice, which only further agitated patient. LRT requested that peer stop interaction with patient. Following LRT request patient started grabbing the paper off the floor in a angry manner and shoved it in the trash can in the day room and exited the room. Approximately 10 minutes later patient returned to day room and apologized for behavior. Patient again asked why she had to feel. LRT encouraged patient to speak with LCSW and physician about concerns about feelings. Patient asked for more paper, which  she was given. Patient remained in session for remainder of group, patient wrote on paper she asked for, but tore the paper up and placed it in the trash can without showing it to anyone.   Marykay Lex Chelsei Mcchesney, LRT/CTRS  Paytin Ramakrishnan L 07/28/2012 4:06 PM

## 2012-07-28 NOTE — Progress Notes (Signed)
D: Pt denies SI/HI. Pt reports hearing voices and seeing things. Per pt, "I feel like different races are against me and I do not know who to believe.  She reports hearing good and evil voices but mostly evil. Per pt, occasionally she continues to see her son as a voo doo doll. She feels confused and does not know what to believe. Pt is agitated and paranoid today. Pt mood is up and down. Pt presents with disorganized thoughts and have a hard time processing information and slow to respond. A: medications administered as ordered per MD. Verbal support given. Pt encouraged to attend groups. 15 minute checks performed for safety. R: Pt irritable. Confused. Depressed.

## 2012-07-29 DIAGNOSIS — F259 Schizoaffective disorder, unspecified: Principal | ICD-10-CM

## 2012-07-29 NOTE — Progress Notes (Addendum)
[facility] MD Progress Note  07/29/2012 1:49 PM Monica Byrd  MRN:  [id number] Subjective:  "I'm getting better." "I don't want to shame my family by taking this medication." Objective:  Patient is tearful and labile this morning, still reports hearing voices and is still very paranoid. She has been isolating to her room. She has declined the Haldol and the prozac stating that she doesn't think she needs it. Kennyth Arnold does however admit to smoking a great deal of THC prior to her admission. This provider spent a good amount of time discussing her situation, stressors, and her job. She is considering changing to another place instead of [facility]. Diagnosis:  Schizoaffective disorder vs. Substance induced mood disorder  ADL's:  Intact  Sleep: Fair  Appetite:  Fair  Suicidal Ideation:  denies Homicidal Ideation:  denies AEB (as evidenced by): Observation and patient's report of decreasing symptoms.  Psychiatric Specialty Exam: Review of Systems  Constitutional: Negative.  Negative for fever, chills, weight loss, malaise/fatigue and diaphoresis.  HENT: Negative for congestion and sore throat.   Eyes: Negative for blurred vision, double vision and photophobia.  Respiratory: Negative for cough, shortness of breath and wheezing.   Cardiovascular: Negative for chest pain, palpitations and PND.  Gastrointestinal: Negative for heartburn, nausea, vomiting, abdominal pain, diarrhea and constipation.  Musculoskeletal: Negative for myalgias, joint pain and falls.  Neurological: Negative for dizziness, tingling, tremors, sensory change, speech change, focal weakness, seizures, loss of consciousness, weakness and headaches.  Endo/Heme/Allergies: Negative for polydipsia. Does not bruise/bleed easily.  Psychiatric/Behavioral: Negative for depression, suicidal ideas, hallucinations, memory loss and substance abuse. The patient is not nervous/anxious and does not have insomnia.     Blood pressure  117/82, pulse 102, temperature 98.4 F (36.9 C), temperature source Oral, resp. rate 20, height 5\' 3"  (1.6 m), weight 62.596 kg (138 lb), last menstrual period 07/15/2012.Body mass index is 24.45 kg/(m^2).  General Appearance: Disheveled  Eye Contact::  Poor  Speech:  Slow  Volume:  Decreased  Mood:  Depressed  Affect:  Labile and Tearful  Thought Process:  Disorganized  Orientation:  NA  Thought Content:  Hallucinations: Auditory and Paranoid Ideation  Suicidal Thoughts:  No  Homicidal Thoughts:  No  Memory:  Immediate;   Poor  Judgement:  Impaired  Insight:  Lacking  Psychomotor Activity:  Decreased  Concentration:  Poor  Recall:  Poor  Akathisia:  No  Handed:  Right  AIMS (if indicated):     Assets:  Communication Skills Desire for Improvement Physical Health Social Support Vocational/Educational  Sleep:  Number of Hours: 6.25   Current Medications: Current Facility-Administered Medications  Medication Dose Route Frequency Provider Last Rate Last Dose  . acetaminophen (TYLENOL) tablet 650 mg  650 mg Oral Q6H PRN Shuvon Rankin, NP   650 mg at 07/27/12 1303  . alum & mag hydroxide-simeth (MAALOX/MYLANTA) 200-200-20 MG/5ML suspension 30 mL  30 mL Oral Q4H PRN Shuvon Rankin, NP      . benztropine (COGENTIN) tablet 0.5 mg  0.5 mg Oral BID Mojeed Akintayo   0.5 mg at 07/29/12 0743  . feeding supplement (ENSURE COMPLETE) liquid 237 mL  237 mL Oral BID BM Jeoffrey Massed, RD   237 mL at 07/29/12 0944  . FLUoxetine (PROZAC) capsule 20 mg  20 mg Oral Daily Mojeed Akintayo   20 mg at 07/29/12 0744  . haloperidol (HALDOL) tablet 5 mg  5 mg Oral BID Mojeed Akintayo   5 mg at 07/29/12 0743  . magnesium  hydroxide (MILK OF MAGNESIA) suspension 30 mL  30 mL Oral Daily PRN Shuvon Rankin, NP      . multivitamin with minerals tablet 1 tablet  1 tablet Oral Daily Jeoffrey Massed, RD   1 tablet at 07/29/12 0743  . OLANZapine zydis (ZYPREXA) disintegrating tablet 10 mg  10 mg Oral Q8H PRN Mojeed  Akintayo   10 mg at 07/28/12 0032  . potassium chloride (K-DUR,KLOR-CON) CR tablet 10 mEq  10 mEq Oral Daily Verne Spurr, PA-C   10 mEq at 07/29/12 0743  . traZODone (DESYREL) tablet 50 mg  50 mg Oral QHS PRN Shuvon Rankin, NP   50 mg at 07/25/12 0154    Lab Results: No results found for this or any previous visit (from the past 48 hour(s)).  Physical Findings: AIMS: Facial and Oral Movements Muscles of Facial Expression: None, normal Lips and Perioral Area: None, normal Jaw: None, normal Tongue: None, normal,Extremity Movements Upper (arms, wrists, hands, fingers): None, normal Lower (legs, knees, ankles, toes): None, normal, Trunk Movements Neck, shoulders, hips: None, normal, Overall Severity Severity of abnormal movements (highest score from questions above): None, normal Incapacitation due to abnormal movements: None, normal Patient's awareness of abnormal movements (rate only patient's report): No Awareness, Dental Status Current problems with teeth and/or dentures?: No Does patient usually wear dentures?: No  CIWA:  CIWA-Ar Total: 0 COWS:     Treatment Plan Summary: Daily contact with patient to assess and evaluate symptoms and progress in treatment Medication management  Plan: 1. Continue crisis management and stabilization. 2. Medication management to reduce current symptoms to base line and improve patient's overall level of functioning 3. Treat health problems as indicated. 4. Develop treatment plan to decrease risk of relapse upon discharge and the need for readmission. 5. Psycho-social education regarding relapse prevention and self care. 6. Health care follow up as needed for medical problems. 7. Continue home medications where appropriate. 8. Patient is encouraged to take her Prozac as well as her Haldol as written. 9. ELOS: 3-5 days.   Medical Decision Making Problem Points:  Established problem, stable/improving (1), New problem, with no additional work-up  planned (3) and Review of psycho-social stressors (1) Data Points:  Order Aims Assessment (2) Review or order medicine tests (1)  I certify that inpatient services furnished can reasonably be expected to improve the patient's condition.  Rona Ravens. Kinnick Maus RPAC 2:10 PM 07/29/2012

## 2012-07-29 NOTE — Progress Notes (Signed)
Pt is up and visible on the unit this evening.  On assessment, pt is in her room sitting on the bed.  When asked if she has any concerns, pt says she wants to make sure she is "using everything properly".  She specifically asked about the air unit.  This Clinical research associate explained how it works and that she can set the temp as she wants it since she is the only pt in the room.  Asked if she needed writer to show her how to set it, she said she could figure it out, then before RN left the room she wanted writer to set it for her.  Pt was acting bizarre/guarded during this time.  Pt denied SI/HI/AV, but seemed to be responding to internal stimuli.  Support and encouragement offered.  After leaving pt's room, called pt's mother who had requested a call from the RN.  Pt's mother is concerned about pt's change in mood.  She feels pt needs to be off the current medication.  She also feels pt does not need the Ensures that have been ordered.  Encouraged mother to contact the CM on Wednesday to discuss her concerns and maybe schedule a family meeting.  Will pass this info to first shift tomorrow.  Pt is safe with q15 minute checks.

## 2012-07-29 NOTE — BHH Group Notes (Signed)
[facility] LCSW Aftercare Discharge Planning Group Note   07/29/2012 4:50 PM  Participation Quality:  Did not attend    [place]

## 2012-07-29 NOTE — Progress Notes (Signed)
D: Pt denies S/HI. Pt reports hearing voices telling her to go against any race that is not her own. Pt is paranoid, confused and slow to processing information this morning. Pt is easily agitated because she is having a hard time trusting other people that do not reflect her skin tone. Pt cautious about taking her meds. Pt feels that she do not need meds and that her problems will eventually go away on its own. Pt refused to take Prozac this am. PA made aware.  Pt has minimal interaction on the milieu. A: Medications offered and administered as ordered per MD. Verbal support given. Pt encouraged to attend groups. 15 minute checks performed for safety. R: Depressed mood. Flat affect. Tearful

## 2012-07-29 NOTE — Tx Team (Signed)
  Interdisciplinary Treatment Plan Update   Date Reviewed:  07/29/2012  Time Reviewed:  4:45 PM  Progress in Treatment:   Attending groups: No Participating in groups: No Taking medication as prescribed: Yes  Tolerating medication: Yes Family/Significant other contact made: No Patient understands diagnosis: No Limited insight Discussing patient identified problems/goals with staff: Yes Medical problems stabilized or resolved: Yes Denies suicidal/homicidal ideation: Yes  In tx team Patient has not harmed self or others: Yes  For review of initial/current patient goals, please see plan of care.  Estimated Length of Stay:  4-5 days  Reason for Continuation of Hospitalization: Hallucinations, Medication Managment  New Problems/Goals identified:  N/A  Discharge Plan or Barriers:   unknown, unable to converse  Additional Comments:  Pt is still responding to internal stimuli, guarded, suspicious and frequently tearful.  Attendees:  Signature: Thedore Mins, MD 07/29/2012 4:45 PM   Signature: Richelle Ito, LCSW 07/29/2012 4:45 PM  Signature: Verne Spurr, PA 07/29/2012 4:45 PM  Signature: Neill Loft, RN 07/29/2012 4:45 PM  Signature: Liborio Nixon, RN 07/29/2012 4:45 PM  Signature:  07/29/2012 4:45 PM  Signature:   07/29/2012 4:45 PM  Signature:    Signature:    Signature:    Signature:    Signature:    Signature:      Scribe for Treatment Team:   Richelle Ito, LCSW  07/29/2012 4:45 PM

## 2012-07-29 NOTE — Clinical Social Work Note (Signed)
  Type of Therapy: Process Group Therapy  Participation Level:  Did Not Attend    Summary of Progress/Problems: Today's group addressed the issue of overcoming obstacles.  Patients were asked to identify their biggest obstacle post d/c that stands in the way of their on-going success, and then problem solve as to how to manage this.       Ida Rogue 07/29/2012   4:45 PM

## 2012-07-30 MED ORDER — HALOPERIDOL 2 MG PO TABS
2.0000 mg | ORAL_TABLET | ORAL | Status: DC
  Administered 2012-07-31: 2 mg via ORAL
  Filled 2012-07-30: qty 2
  Filled 2012-07-30 (×2): qty 1

## 2012-07-30 MED ORDER — HALOPERIDOL 5 MG PO TABS
8.0000 mg | ORAL_TABLET | Freq: Every day | ORAL | Status: DC
  Filled 2012-07-30: qty 1

## 2012-07-30 MED ORDER — LORAZEPAM 1 MG PO TABS
2.0000 mg | ORAL_TABLET | ORAL | Status: DC | PRN
  Administered 2012-07-30 – 2012-08-06 (×8): 2 mg via ORAL
  Filled 2012-07-30 (×5): qty 2
  Filled 2012-07-30: qty 1
  Filled 2012-07-30 (×2): qty 2
  Filled 2012-07-30: qty 1

## 2012-07-30 NOTE — Progress Notes (Signed)
Recreation Therapy Notes  Date: 05.28.2014  Time: 9:30am  Location: [address] Dayroom   Group Topic/Focus: Problem Solving   Participation Level:  Active   Participation Quality:  Appropriate   Affect:  Flat, Tearful   Cognitive:  Appropriate   Additional Comments: Activity: Life Boat ; Explanation: LRT told patients the following scenario: You have charted a yacht for the afternoon, half way through your trip the boat springs a leak. You can saves yourselves as well as 8 of the following people: Darliss Cheney, Laverta [place], Pregnant Woman, Female physician, Ex-Marine, Curator, [place], Ex-Convict, Rabbi, Anthonette Legato, Optician, dispensing, Runner, broadcasting/film/video, Investment banker, operational, Engineer, civil (consulting).   Patient entered group space and sat next to peer. Patient stared at floor in front of her and did not engage in activity. It appeared that patient was not paying attention or involved in activity. Patient then stated "doesn't everyone deserve to live." LRT explained that yes everyone deserves to live, however this was an exercise in problem solving and decision making which requires that they leave some people behind. Patient nodded her head yes in agreement, but then went back to staring at the floor. Patient became tearful at this point and could be seen wiping tears from her eyes. Approximately 5 minutes later patient chimed into conversation again making a case for letting the teacher on the life boat. Patient offered no justification for keeping the teacher, however peers agreed to patient choice. Following this patient returned to staring blankly in front of her, patient made no additional comments during group and had no further involvement with activity.   Marykay Lex Nataleigh Griffin, LRT/CTRS  Kohan Azizi L 07/30/2012 2:19 PM

## 2012-07-30 NOTE — Progress Notes (Addendum)
D: Patient denies SI/HI and visual hallucinations and admits to some auditory hallucinations that she states " the enemy is telling me to tell people what is wrong with me"; patient begins to talk about the doctor telling her she has several cavities and she thinks that every body knows about them and are talking about it and she proceeds to tell writer that she has read that you should not worry about what other people think;  patient reports sleep is fair; reports appetite is improving ; reports ability to pay attention is improving; rates depression as 7/10; rates hopelessness 5/10; rates anxiety as 0/10  A: Monitored q 15 minutes; patient encouraged to attend groups; patient educated about medications; patient given medications per physician orders; patient encouraged to express feelings and/or concerns  R: Patient is suspicious but cooperative; patient became tearful about missing her son and talking about her past haunting her and also talking about now believing the old and new testament; patient's interaction with staff and peers is very minimal and mostly isolates to her room;  of SI; patient is taking medications as prescribed and tolerating medications; patient is attending some groups but forwarding very little information during sessions    Patient also reports " the little me haunts the older me and I think people hold my past behind me; patient reports" I just feels as if everybody knows about me"; patient also reports that the enemy is trying to hold her past against her and she said prayer helps her but that she does not pray as she should; patient was asked to explain what things from her past she feels haunts her and she reports " i think its best if I dont bring them back up"

## 2012-07-30 NOTE — [facility] Group Notes (Signed)
[facility] LCSW Aftercare Discharge Planning Group Note   07/30/2012 8:37 AM  Participation Quality:  Minimal  Mood/Affect:  Blunted  Depression Rating:    Anxiety Rating:    Thoughts of Suicide:  No Will you contract for safety?   NA  Current AVH:  Yes  Plan for Discharge/Comments:  States she thinks she is making a bit of progress, but also states that mother, who visited last night, thinks she is not.  "She is concerned because I am allowing the evil one to torment me." Rates he torment at a 10.  Can't say how we can help her.  "I think you are doing everything for me that you can."  Transportation Means: mother  Supports:mother  [place], Baldo Daub

## 2012-07-30 NOTE — Progress Notes (Signed)
Patient ID: Monica Byrd, female   DOB: 1985-11-14, 27 y.o.   MRN: [id number] [place] MD Progress Note  07/30/2012 5:04 PM Monica Byrd  MRN:  [id number]  Subjective: "feeling better."  Objective:  Patient is quiet, guarded and very paranoid. She is voicing mixed messages about her self worth and guilt but refuses to disclose further information. She is over whelmed about the medication.  Diagnosis:  Schizoaffective disorder vs. Substance induced mood disorder  ADL's:  Intact  Sleep: Fair  Appetite:  Fair  Suicidal Ideation:  denies Homicidal Ideation:  denies AEB (as evidenced by): Observation and patient's report of decreasing symptoms.  Psychiatric Specialty Exam: Review of Systems  Constitutional: Negative.  Negative for fever, chills, weight loss, malaise/fatigue and diaphoresis.  HENT: Negative for congestion and sore throat.   Eyes: Negative for blurred vision, double vision and photophobia.  Respiratory: Negative for cough, shortness of breath and wheezing.   Cardiovascular: Negative for chest pain, palpitations and PND.  Gastrointestinal: Negative for heartburn, nausea, vomiting, abdominal pain, diarrhea and constipation.  Musculoskeletal: Negative for myalgias, joint pain and falls.  Neurological: Negative for dizziness, tingling, tremors, sensory change, speech change, focal weakness, seizures, loss of consciousness, weakness and headaches.  Endo/Heme/Allergies: Negative for polydipsia. Does not bruise/bleed easily.  Psychiatric/Behavioral: Negative for depression, suicidal ideas, hallucinations, memory loss and substance abuse. The patient is not nervous/anxious and does not have insomnia.     Blood pressure 114/72, pulse 81, temperature 97.4 F (36.3 C), temperature source Oral, resp. rate 16, height 5\' 3"  (1.6 m), weight 62.596 kg (138 lb), last menstrual period 07/15/2012.Body mass index is 24.45 kg/(m^2).  General Appearance: Disheveled  Eye Contact::   Poor  Speech:  Slow  Volume:  Decreased  Mood:  Depressed  Affect:  Labile and Tearful  Thought Process:  Disorganized  Orientation:  NA  Thought Content:  Hallucinations: Auditory and Paranoid Ideation  Suicidal Thoughts:  No  Homicidal Thoughts:  No  Memory:  Immediate;   Poor  Judgement:  Impaired  Insight:  Lacking  Psychomotor Activity:  Decreased  Concentration:  Poor  Recall:  Poor  Akathisia:  No  Handed:  Right  AIMS (if indicated):     Assets:  Communication Skills Desire for Improvement Physical Health Social Support Vocational/Educational  Sleep:  Number of Hours: 6.75   Current Medications: Current Facility-Administered Medications  Medication Dose Route Frequency Provider Last Rate Last Dose  . acetaminophen (TYLENOL) tablet 650 mg  650 mg Oral Q6H PRN Shuvon Rankin, NP   650 mg at 07/27/12 1303  . alum & mag hydroxide-simeth (MAALOX/MYLANTA) 200-200-20 MG/5ML suspension 30 mL  30 mL Oral Q4H PRN Shuvon Rankin, NP      . benztropine (COGENTIN) tablet 0.5 mg  0.5 mg Oral BID Mojeed Akintayo   0.5 mg at 07/30/12 1656  . feeding supplement (ENSURE COMPLETE) liquid 237 mL  237 mL Oral BID BM Jeoffrey Massed, RD   237 mL at 07/30/12 1051  . FLUoxetine (PROZAC) capsule 20 mg  20 mg Oral Daily Mojeed Akintayo   20 mg at 07/30/12 7829  . haloperidol (HALDOL) tablet 5 mg  5 mg Oral BID Mojeed Akintayo   5 mg at 07/30/12 1656  . LORazepam (ATIVAN) tablet 2 mg  2 mg Oral Q4H PRN Mojeed Akintayo      . magnesium hydroxide (MILK OF MAGNESIA) suspension 30 mL  30 mL Oral Daily PRN Shuvon Rankin, NP      . multivitamin with  minerals tablet 1 tablet  1 tablet Oral Daily Jeoffrey Massed, RD   1 tablet at 07/30/12 [phone]  . potassium chloride (K-DUR,KLOR-CON) CR tablet 10 mEq  10 mEq Oral Daily Verne Spurr, PA-C   10 mEq at 07/30/12 0819  . traZODone (DESYREL) tablet 50 mg  50 mg Oral QHS PRN Shuvon Rankin, NP   50 mg at 07/25/12 0154    Lab Results: No results found for this or  any previous visit (from the past 48 hour(s)).  Physical Findings: AIMS: Facial and Oral Movements Muscles of Facial Expression: None, normal Lips and Perioral Area: None, normal Jaw: None, normal Tongue: None, normal,Extremity Movements Upper (arms, wrists, hands, fingers): None, normal Lower (legs, knees, ankles, toes): None, normal, Trunk Movements Neck, shoulders, hips: None, normal, Overall Severity Severity of abnormal movements (highest score from questions above): None, normal Incapacitation due to abnormal movements: None, normal Patient's awareness of abnormal movements (rate only patient's report): No Awareness, Dental Status Current problems with teeth and/or dentures?: No Does patient usually wear dentures?: No  CIWA:  CIWA-Ar Total: 0 COWS:     Treatment Plan Summary: Daily contact with patient to assess and evaluate symptoms and progress in treatment Medication management  Plan: 1. Continue crisis management and stabilization. 2. Medication management to reduce current symptoms to base line and improve patient's overall level of functioning 3. Treat health problems as indicated. 4. Develop treatment plan to decrease risk of relapse upon discharge and the need for readmission. 5. Psycho-social education regarding relapse prevention and self care. 6. Health care follow up as needed for medical problems. 7. Continue home medications where appropriate. 8. Patient is encouraged to take her Prozac as well as her Haldol as written. 9. ELOS: 3-5 days. 10. Had a lengthy discussion with the patient's mother regarding medication and we will continue the Haldol but will increase it due to her psychosis. 11. Will also continue the cogentin and prozac as well. 12. Will d/c the trazodone due to night mares.  Medical Decision Making Problem Points:  Established problem, stable/improving (1), New problem, with no additional work-up planned (3) and Review of psycho-social stressors  (1) Data Points:  Order Aims Assessment (2) Review or order medicine tests (1)  I certify that inpatient services furnished can reasonably be expected to improve the patient's condition.  Rona Ravens. Dashley Monts RPAC 5:36 PM 07/30/2012

## 2012-07-30 NOTE — Progress Notes (Signed)
The focus of this group is to help patients review their daily goal of treatment and discuss progress on daily workbooks. Pt reluctantly attended the evening group session and responded minimally to discussion prompts from the Writer. Pt stated that a positive thing from today was that her Mom was able to visit and that they spent quality time together. Pt also reported being able to talk to her family on the phone, which she referred to as "the so-called people who are supposed to love me." Pt reported being unsure about her family, saying "I just want the truth from them." Pt would not elaborate on these statements and did not make eye contact with the Writer while speaking/being spoken to - Pt only stared forward in space. Pt's affect was flat.

## 2012-07-30 NOTE — Clinical Social Work Note (Signed)
[facility] [employer] Group Therapy  07/30/2012 , 1:27 PM    Type of Therapy:  [employer] Presentation  Participation Level:  Did not attend      Summary of Progress/Problems:  Onalee Hua from [employer] came to present his recovery story and play the guitar.    Daryel Gerald B 07/30/2012 , 1:27 PM

## 2012-07-30 NOTE — Progress Notes (Signed)
Pt observed in her room in bed eating a snack.  She gives short, one word answers to writer's questions.  This Clinical research associate offered pt ice water with her snack which she accepted.  Pt is unsure when she will be discharged.  She looks very anxious, although she voices no needs or concerns at this time.  She says she has attended groups.  She denies SI/HI/AV.  Pt had some med changes made today.  Informed pt that she has no meds scheduled until the morning.  Support and encouragement offered.  Safety maintained with q15 minute checks.

## 2012-07-31 LAB — HCG, SERUM, QUALITATIVE: Preg, Serum: NEGATIVE

## 2012-07-31 MED ORDER — BENZTROPINE MESYLATE 1 MG PO TABS
1.0000 mg | ORAL_TABLET | Freq: Every day | ORAL | Status: DC
  Administered 2012-07-31 – 2012-08-03 (×4): 1 mg via ORAL
  Filled 2012-07-31 (×5): qty 1

## 2012-07-31 MED ORDER — HALOPERIDOL 5 MG PO TABS
10.0000 mg | ORAL_TABLET | Freq: Every day | ORAL | Status: DC
  Administered 2012-07-31 – 2012-08-02 (×3): 10 mg via ORAL
  Filled 2012-07-31 (×4): qty 2

## 2012-07-31 NOTE — [facility] Group Notes (Signed)
[facility] LCSW Aftercare Discharge Planning Group Note   07/31/2012 10:08 AM  Participation Quality:  Minimal  Mood/Affect:  Flat  Depression Rating:  denies  Anxiety Rating:  denies  Thoughts of Suicide:  No Will you contract for safety?   NA  Current AVH:  No  Plan for Discharge/Comments:  A is a bit more engaged today, responses are not as latent.  States she feels she is progressing, and even though she feels like she does not need the medication, she will continue to take it.  Wants to leave.  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  Transportation Means: mother  Supports: mother  Ida Rogue

## 2012-07-31 NOTE — Progress Notes (Addendum)
Patient ID: Monica Byrd, female   DOB: May 03, 1985, 27 y.o.   MRN: [id number] D:  Patient's self inventory sheet, patient sleeps well, has good appetite, normal energy level, good attention span.  Rated depression #2, hopelessness #1.  Has experienced withdrawals, tremors, cravings, agitation.  Denied SI.  Has experienced headaches and feels sleepy.  Pain goal today #4, worst pain #1.   After discharge, plans to "stay in touch with all aftercare procedures.  Ex counseling, medicine, treatment.  I really appreciate the work and care that was provided for me, during my stay here."  Does have discharge plans.  No problems taking meds after discharge.   A:  Medications administered per MD orders.  Emotional support and encouragement given patient. R:  Denied SI and HI, contracts for safety.  Has visions of God/Devil this morning, also hears voices.  Stated "I just don't feel right."   Denied nightmares.  Patient needs to take her meds, go to groups, participate in everything she can while at [facility].  Staff wants her to be successful before she is discharged.    1800  Patient will discuss with MD Friday 08/01/2012 about signing 72 hr discharge form.  Patient wrote note to MD about discharge, thanking staff for excellent care.

## 2012-07-31 NOTE — Progress Notes (Signed)
Patient ID: Monica Byrd, female   DOB: [birth year], 27 y.o.   MRN: [id number] [facility] MD Progress Note  07/31/2012 10:36 AM Monica Byrd  MRN:  [id number] Subjective: "If I have a choice I will not take the medication, I prefer Marijuana". Objective: Patient reports ongoing psychosis, anxiety and delusional thinking. She insists that she is pregnant and says that people are spying on her. She has been taking her medications after so much persuasion, so far she has not endorsed any adverse reactions.  Diagnosis:  Axis I: PTSD                               Cannabis use disorder. Drug induced psychosis.   ADL's:  Intact  Sleep: fair  Appetite:  Fair  Suicidal Ideation:  Plan:  denies Intent:  denies Means:  denies Homicidal Ideation:  Plan:  denies Intent:  denies Means:  denies AEB (as evidenced by):  Psychiatric Specialty Exam: Review of Systems  Constitutional: Negative.   HENT: Negative.   Eyes: Negative.   Respiratory: Negative.   Cardiovascular: Negative.   Gastrointestinal: Negative.   Genitourinary: Negative.   Musculoskeletal: Negative.   Skin: Negative.   Neurological: Negative.   Endo/Heme/Allergies: Negative.   Psychiatric/Behavioral: Positive for hallucinations and substance abuse. The patient is nervous/anxious and has insomnia.     Blood pressure 111/75, pulse 94, temperature 98.1 F (36.7 C), temperature source Oral, resp. rate 16, height 5\' 3"  (1.6 m), weight 62.596 kg (138 lb), last menstrual period 07/15/2012.Body mass index is 24.45 kg/(m^2).  General Appearance: Fairly Groomed  Patent attorney::  Poor  Speech:  Slow  Volume:  Decreased  Mood:  Anxious and Depressed  Affect:  Blunt  Thought Process:  Disorganized  Orientation:  Full (Time, Place, and Person)  Thought Content:  Hallucinations: Auditory and Paranoid Ideation  Suicidal Thoughts:  No  Homicidal Thoughts:  No  Memory:  Immediate;   Fair Recent;   Fair Remote;   Poor  Judgement:   Poor  Insight:  Lacking  Psychomotor Activity:  Decreased  Concentration:  Poor  Recall:  Poor  Akathisia:  No  Handed:  Right  AIMS (if indicated):     Assets:  Desire for Improvement Housing Others:  employed  Sleep:  Number of Hours: 6   Current Medications: Current Facility-Administered Medications  Medication Dose Route Frequency Provider Last Rate Last Dose  . acetaminophen (TYLENOL) tablet 650 mg  650 mg Oral Q6H PRN Shuvon Rankin, NP   650 mg at 07/27/12 1303  . alum & mag hydroxide-simeth (MAALOX/MYLANTA) 200-200-20 MG/5ML suspension 30 mL  30 mL Oral Q4H PRN Shuvon Rankin, NP      . benztropine (COGENTIN) tablet 1 mg  1 mg Oral QHS Latreece Mochizuki      . feeding supplement (ENSURE COMPLETE) liquid 237 mL  237 mL Oral BID BM Jeoffrey Massed, RD   237 mL at 07/30/12 1051  . FLUoxetine (PROZAC) capsule 20 mg  20 mg Oral Daily Ahlivia Salahuddin   20 mg at 07/31/12 0807  . haloperidol (HALDOL) tablet 10 mg  10 mg Oral QHS Kalden Wanke      . LORazepam (ATIVAN) tablet 2 mg  2 mg Oral Q4H PRN Bonny Vanleeuwen   2 mg at 07/30/12 2156  . magnesium hydroxide (MILK OF MAGNESIA) suspension 30 mL  30 mL Oral Daily PRN Shuvon Rankin, NP      .  multivitamin with minerals tablet 1 tablet  1 tablet Oral Daily Jeoffrey Massed, RD   1 tablet at 07/31/12 1610  . potassium chloride (K-DUR,KLOR-CON) CR tablet 10 mEq  10 mEq Oral Daily Verne Spurr, PA-C   10 mEq at 07/31/12 9604    Lab Results: No results found for this or any previous visit (from the past 48 hour(s)).  Physical Findings: AIMS: Facial and Oral Movements Muscles of Facial Expression: None, normal Lips and Perioral Area: None, normal Jaw: None, normal Tongue: None, normal,Extremity Movements Upper (arms, wrists, hands, fingers): None, normal Lower (legs, knees, ankles, toes): None, normal, Trunk Movements Neck, shoulders, hips: None, normal, Overall Severity Severity of abnormal movements (highest score from questions above):  None, normal Incapacitation due to abnormal movements: None, normal Patient's awareness of abnormal movements (rate only patient's report): No Awareness, Dental Status Current problems with teeth and/or dentures?: No Does patient usually wear dentures?: No  CIWA:  CIWA-Ar Total: 1 COWS:  COWS Total Score: 2  Treatment Plan Summary: Daily contact with patient to assess and evaluate symptoms and progress in treatment Medication management  Plan:1. Admit for crisis management and stabilization. 2. Medication management to reduce current symptoms to base line and improve the     patient's overall level of functioning 3. Treat health problems as indicated. 4. Develop treatment plan to decrease risk of relapse upon discharge and the need for     readmission. 5. Psycho-social education regarding relapse prevention and self care. 6. Health care follow up as needed for medical problems. 7. Continue Haldol 10mg  po daily at bedtime for psychosis 8. Continue Cogentin 1mg  po daily at bedtime for EPS prevention 8  Increase Prozac to 20mg  po daily for depression/anxiety and PTSD symptoms   Medical Decision Making Problem Points:  Established problem, stable/improving (1), Review of last therapy session (1) and Review of psycho-social stressors (1) Data Points:  Order Aims Assessment (2) Review of medication regiment & side effects (2) Review of new medications or change in dosage (2)  I certify that inpatient services furnished can reasonably be expected to improve the patient's condition.   Brevon Dewald,MD 07/31/2012, 10:36 AM

## 2012-07-31 NOTE — Progress Notes (Signed)
Patient ID: Ruben Gottron, female   DOB: 1985-03-31, 27 y.o.   MRN: [id number]  D: Pt endorses AH, but will contract for safety  Pt denies SI/HI/VH. Pt is pleasant and cooperative. Pt tearful upon approach, pt states "I'm just ready to go home, can I just go home". Pt very paranoid, anxious, and worried. " Am I safe here, can people from my past get me". Pt more concerned when she can leave.   A: Pt was offered support and encouragement. Pt was given scheduled medications. Pt was encourage to attend groups. Q 15 minute checks were done for safety.  Pt given 2mg  ativan for anxiety at 2010. Pt informed that she needs to be stabilized on medication before she can leave, and she needs to participate in her treatment plan.   R:Pt attends groups and interacts well with peers and staff. Pt is taking medication. Pt has no complaints at this time.Pt  safety maintained on unit.

## 2012-07-31 NOTE — Clinical Social Work Note (Signed)
[facility] Group Notes:  (Counselor/Nursing/MHT/Case Management/Adjunct)  01/17/2012 2:20 PM  Type of Therapy:  Group Therapy  Participation Level:  Did Not Attend    Summary of Progress/Problems: .balance: The topic for group was balance in life.  Pt participated in the discussion about when their life was in balance and out of balance and how this feels.  Pt discussed ways to get back in balance and short term goals they can work on to get where they want to be.    Monica Byrd 01/17/2012, 2:20 PM  

## 2012-08-01 LAB — COMPREHENSIVE METABOLIC PANEL
ALT: 13 U/L (ref 0–35)
AST: 18 U/L (ref 0–37)
Calcium: 9.5 mg/dL (ref 8.4–10.5)
GFR calc Af Amer: 81 mL/min — ABNORMAL LOW (ref >90)
GFR calc non Af Amer: 70 mL/min — ABNORMAL LOW (ref >90)
Sodium: 140 mEq/L (ref 135–145)
Total Protein: 7.6 g/dL (ref 6.0–8.3)

## 2012-08-01 NOTE — Progress Notes (Signed)
Patient ID: Monica Byrd, female   DOB: 04/12/1985, 27 y.o.   MRN: [id number] [facility] MD Progress Note  08/01/2012 3:21 PM CORAH WILLEFORD  MRN:  [id number] Subjective: "It feels like I'm trying to figure everything out all at one time."  Objective: Patient reports ongoing psychosis, anxiety and delusional thinking. Patient requested 72 hour discharge today. This provider spent an hour with the patient and her mother yesterday discussing the medication, the symptoms, psychosis, side effects and treatment upon discharge. The patient today shows poor insight, poor judgement, affect is labile and tearful, discusses her BF and his desires to change their relationship. She is more paranoid with poor anxiety today.    Did motivational interviewing to discover her concerns that led to her use of THC. Explained the need to work on depression and anxiety that resulted in her use of THC. Diagnosis:  Axis I: PTSD, schizoaffective disorder                               Cannabis use disorder. Drug induced psychosis. ADL's:  Intact  Sleep: fair  Appetite:  Fair  Suicidal Ideation:  Plan:  denies Intent:  denies Means:  denies Homicidal Ideation:  Plan:  denies Intent:  denies Means:  denies AEB (as evidenced by):  Psychiatric Specialty Exam: Review of Systems  Constitutional: Negative.   HENT: Negative.   Eyes: Negative.   Respiratory: Negative.   Cardiovascular: Negative.   Gastrointestinal: Negative.   Genitourinary: Negative.   Musculoskeletal: Negative.   Skin: Negative.   Neurological: Negative.   Endo/Heme/Allergies: Negative.   Psychiatric/Behavioral: Positive for hallucinations and substance abuse. The patient is nervous/anxious and has insomnia.     Blood pressure 92/63, pulse 100, temperature 98.2 F (36.8 C), temperature source Oral, resp. rate 18, height 5\' 3"  (1.6 m), weight 62.596 kg (138 lb), last menstrual period 07/15/2012.Body mass index is 24.45 kg/(m^2).  General  Appearance: Fairly Groomed  Patent attorney::  Poor  Speech:  Slow  Volume:  Decreased  Mood:  Anxious and Depressed  Affect:  Blunt  Thought Process:  Disorganized  Orientation:  Full (Time, Place, and Person)  Thought Content:  Hallucinations: Auditory and Paranoid Ideation  Suicidal Thoughts:  No  Homicidal Thoughts:  No  Memory:  Immediate;   Fair Recent;   Fair Remote;   Poor  Judgement:  Poor  Insight:  Lacking  Psychomotor Activity:  Decreased  Concentration:  Poor  Recall:  Poor  Akathisia:  No  Handed:  Right  AIMS (if indicated):     Assets:  Desire for Improvement Housing Others:  employed  Sleep:  Number of Hours: 6.75   Current Medications: Current Facility-Administered Medications  Medication Dose Route Frequency Provider Last Rate Last Dose  . acetaminophen (TYLENOL) tablet 650 mg  650 mg Oral Q6H PRN Shuvon Rankin, NP   650 mg at 07/27/12 1303  . alum & mag hydroxide-simeth (MAALOX/MYLANTA) 200-200-20 MG/5ML suspension 30 mL  30 mL Oral Q4H PRN Shuvon Rankin, NP      . benztropine (COGENTIN) tablet 1 mg  1 mg Oral QHS Mojeed Akintayo   1 mg at 07/31/12 2208  . feeding supplement (ENSURE COMPLETE) liquid 237 mL  237 mL Oral BID BM Jeoffrey Massed, RD   237 mL at 08/01/12 0914  . FLUoxetine (PROZAC) capsule 20 mg  20 mg Oral Daily Mojeed Akintayo   20 mg at 08/01/12 0754  .  haloperidol (HALDOL) tablet 10 mg  10 mg Oral QHS Mojeed Akintayo   10 mg at 07/31/12 2207  . LORazepam (ATIVAN) tablet 2 mg  2 mg Oral Q4H PRN Mojeed Akintayo   2 mg at 07/31/12 2010  . magnesium hydroxide (MILK OF MAGNESIA) suspension 30 mL  30 mL Oral Daily PRN Shuvon Rankin, NP      . multivitamin with minerals tablet 1 tablet  1 tablet Oral Daily Jeoffrey Massed, RD   1 tablet at 08/01/12 0754  . potassium chloride (K-DUR,KLOR-CON) CR tablet 10 mEq  10 mEq Oral Daily Verne Spurr, PA-C   10 mEq at 08/01/12 1610    Lab Results:  Results for orders placed during the hospital encounter of  07/23/12 (from the past 48 hour(s))  HCG, SERUM, QUALITATIVE     Status: None   Collection Time    07/31/12  7:42 PM      Result Value Range   Preg, Serum NEGATIVE  NEGATIVE   Comment:            THE SENSITIVITY OF THIS     METHODOLOGY IS >10 mIU/mL.    Physical Findings: AIMS: Facial and Oral Movements Muscles of Facial Expression: None, normal Lips and Perioral Area: None, normal Jaw: None, normal Tongue: None, normal,Extremity Movements Upper (arms, wrists, hands, fingers): None, normal Lower (legs, knees, ankles, toes): None, normal, Trunk Movements Neck, shoulders, hips: None, normal, Overall Severity Severity of abnormal movements (highest score from questions above): None, normal Incapacitation due to abnormal movements: None, normal Patient's awareness of abnormal movements (rate only patient's report): No Awareness, Dental Status Current problems with teeth and/or dentures?: No Does patient usually wear dentures?: No  CIWA:  CIWA-Ar Total: 1 COWS:  COWS Total Score: 2  Treatment Plan Summary: Daily contact with patient to assess and evaluate symptoms and progress in treatment Medication management  Plan:1. Admit for crisis management and stabilization. 2. Medication management to reduce current symptoms to base line and improve the     patient's overall level of functioning 3. Treat health problems as indicated. 4. Develop treatment plan to decrease risk of relapse upon discharge and the need for     readmission. 5. Psycho-social education regarding relapse prevention and self care. 6. Health care follow up as needed for medical problems. 7. Continue Haldol 10mg  po daily at bedtime for psychosis 8. Continue Cogentin 1mg  po daily at bedtime for EPS prevention 9  Increase Prozac to 20mg  po daily for depression/anxiety and PTSD symptoms 10. Patient is provided with NIDA information regarding Marijuana and its effects on the brain and body for her to read. 11. Will  encourage this patient to reconsider her motivation for leaving the hospital prior to feeling better.  Medical Decision Making Problem Points:  Established problem, stable/improving (1), Review of last therapy session (1) and Review of psycho-social stressors (1) Data Points:  Order Aims Assessment (2) Review of medication regiment & side effects (2) Review of new medications or change in dosage (2)  I certify that inpatient services furnished can reasonably be expected to improve the patient's condition.  Rona Ravens. Livian Vanderbeck RPAC 3:27 PM 08/01/2012

## 2012-08-01 NOTE — Progress Notes (Signed)
Adult Psychoeducational Group Note  Date:  08/01/2012 Time:  8:00PM Group Topic/Focus:  Wrap-Up Group:   The focus of this group is to help patients review their daily goal of treatment and discuss progress on daily workbooks.  Participation Level:  Active  Participation Quality:  Appropriate and Attentive  Affect:  Appropriate  Cognitive:  Alert and Appropriate  Insight: Appropriate and Good  Engagement in Group:  Engaged  Modes of Intervention:  Discussion  Additional Comments:  Pt. Was attentive and appropriate during tonight's group discussion. Pt stated that is is religious. Pt. Is using religion as a coping skills. Pt. Stated that she will talk to god in stead of people due to feeling that people are going to twist your words or not understand what you are saying.   Bing Plume D 08/01/2012, 8:49 PM

## 2012-08-01 NOTE — Progress Notes (Signed)
D: Patient reports " I think that my boyfriend family is trying to make me believe in something that I don't want to believe in; I believe in honesty and respect but they are trying to get me to believe; they want to make me feel guilty but I have already been forgiven by God"  A: Patient encourage to talk about feelings and thoughts  R: Patient is flat and blunted and still is guarded and forwards little information

## 2012-08-01 NOTE — Progress Notes (Signed)
Recreation Therapy Notes  Date: 05.30.2014 Time: 9:30am Location: [address] Dayroom      Group Topic/Focus: Communication  Participation Level: Active  Participation Quality: Appropriate  Affect: Flat  Cognitive: Oriented   Additional Comments: Activity: Random Words ; Explanation: Patients were asked to select a random word from container and tell a personal story relating to that word. Patients were then asked to identify if they were able to relate to peers stories.   Patient entered group space, LRT asked how she was feeling today, patient responded "paranoid." Patient encouraged to share this with treatment team. Patient participated in group activity. Patient stated the purpose of finding things in common with peers is to "relate to people." Patient listened to remainder of discussion about using communication to build a support system. Patient did not volunteer any additional information to group.   Marykay Lex Darragh Nay, LRT/CTRS  Jearl Klinefelter 08/01/2012 2:43 PM

## 2012-08-01 NOTE — Tx Team (Signed)
  Interdisciplinary Treatment Plan Update   Date Reviewed:  08/01/2012  Time Reviewed:  8:12 AM  Progress in Treatment:   Attending groups: Sporadically Participating in groups: Minimally Taking medication as prescribed: Yes  Tolerating medication: Yes Family/Significant other contact made: Yes  Patient understands diagnosis: No  Minimal insight Discussing patient identified problems/goals with staff: Yes  See initial plan Medical problems stabilized or resolved: Yes Denies suicidal/homicidal ideation: Yes  In tx team Patient has not harmed self or others: Yes  For review of initial/current patient goals, please see plan of care.  Estimated Length of Stay:  3-5 days  Reason for Continuation of Hospitalization: Hallucinations Medication stabilization  New Problems/Goals identified:  N/A  Discharge Plan or Barriers:   return home, follow up outpt  Additional Comments:  Pt continues to present with thought blocking, paranoid presentation.  Is reluctant to take meds, but states she will do so "as long as I am here."  Attendees:  Signature: Thedore Mins, MD 08/01/2012 8:12 AM   Signature: Richelle Ito, LCSW 08/01/2012 8:12 AM  Signature: Verne Spurr, PA 08/01/2012 8:12 AM  Signature:  08/01/2012 8:12 AM  Signature: Isaac Laud , RN 08/01/2012 8:12 AM  Signature:  08/01/2012 8:12 AM  Signature:   08/01/2012 8:12 AM  Signature:    Signature:    Signature:    Signature:    Signature:    Signature:      Scribe for Treatment Team:   Richelle Ito, LCSW  08/01/2012 8:12 AM

## 2012-08-02 DIAGNOSIS — F1010 Alcohol abuse, uncomplicated: Secondary | ICD-10-CM

## 2012-08-02 NOTE — Progress Notes (Signed)
Patient ID: Monica Byrd, female   DOB: 02-19-1986, 27 y.o.   MRN: [id number] Psychoeducational Group Note  Date:  08/02/2012 Time:1000am  Group Topic/Focus:  Identifying Needs:   The focus of this group is to help patients identify their personal needs that have been historically problematic and identify healthy behaviors to address their needs.  Participation Level:  Active  Participation Quality:  Appropriate  Affect:  Flat  Cognitive:  Appropriate  Insight:  Supportive  Engagement in Group:  Supportive  Additional Comments:  Inventory and Psychoeducational group   Valente David 08/02/2012,10:16 AM

## 2012-08-02 NOTE — Progress Notes (Signed)
D: Patient denies SI/HI/AVH. Patient rates hopelessness and depression as 2.  Patient affect is labile. Mood is depressed.  Pt exhibiting signs of paranoid delusions.  Pt came into the hallway irritable asking, "What is going on?  Why am I the only one that does not know what's going on around here?"  Pt states that she slept well and her appetite is good.  Pt states that her energy level is high and her ability to pay attention is good.  Pt states that upon discharge she plans to "follow the leaders, pray more, and seek Godly counsel."  Patient did attend group. Patient visible on the milieu. No distress noted. A: Support and encouragement offered. Scheduled medications given to pt. Q 15 min checks continued for patient safety. R: Patient receptive. Patient remains safe on the unit.

## 2012-08-02 NOTE — Progress Notes (Signed)
Patient ID: Monica Byrd, female   DOB: 17-Sep-1985, 27 y.o.   MRN: [id number] [facility] MD Progress Note  08/02/2012 12:46 PM Monica Byrd  MRN:  [id number] Subjective: "I still has bad thoughts in my mind.  "  Objective: Patient seen and chart review.  Patient continued to exhibit psychosis paranoia delusions and having passive suicidal thinking.  She feels the medicines are working however not fast.  Patient is minimally involved in the groups and did not participate.  She continued to endorse having bad thoughts in her mind.  She denies any tremors or shakes.  She does take medication with some reassurance and encouragement.   Diagnosis:  Axis I: PTSD, schizoaffective disorder                               Cannabis use disorder. Drug induced psychosis. ADL's:  Intact  Sleep: fair  Appetite:  Fair  Suicidal Ideation:  Plan:  denies Intent:  denies Means:  denies Homicidal Ideation:  Plan:  denies Intent:  denies Means:  denies AEB (as evidenced by):  Psychiatric Specialty Exam: Review of Systems  Constitutional: Negative.   HENT: Negative.   Eyes: Negative.   Respiratory: Negative.   Cardiovascular: Negative.   Gastrointestinal: Negative.   Genitourinary: Negative.   Musculoskeletal: Negative.   Skin: Negative.   Neurological: Negative.   Endo/Heme/Allergies: Negative.   Psychiatric/Behavioral: Positive for depression, hallucinations and substance abuse. The patient is nervous/anxious and has insomnia.        Paranoia    Blood pressure 110/69, pulse 87, temperature 97.5 F (36.4 C), temperature source Oral, resp. rate 16, height 5\' 3"  (1.6 m), weight 62.596 kg (138 lb), last menstrual period 07/15/2012.Body mass index is 24.45 kg/(m^2).  General Appearance: Fairly Groomed  Patent attorney::  Poor  Speech:  Slow  Volume:  Decreased  Mood:  Anxious and Depressed  Affect:  Blunt  Thought Process:  Disorganized  Orientation:  Full (Time, Place, and Person)  Thought  Content:  Hallucinations: Auditory and Paranoid Ideation  Suicidal Thoughts:  No  Homicidal Thoughts:  No  Memory:  Immediate;   Fair Recent;   Fair Remote;   Poor  Judgement:  Poor  Insight:  Lacking  Psychomotor Activity:  Decreased  Concentration:  Poor  Recall:  Poor  Akathisia:  No  Handed:  Right  AIMS (if indicated):     Assets:  Desire for Improvement Housing Others:  employed  Sleep:  Number of Hours: 6.5   Current Medications: Current Facility-Administered Medications  Medication Dose Route Frequency Provider Last Rate Last Dose  . acetaminophen (TYLENOL) tablet 650 mg  650 mg Oral Q6H PRN Shuvon Rankin, NP   650 mg at 07/27/12 1303  . alum & mag hydroxide-simeth (MAALOX/MYLANTA) 200-200-20 MG/5ML suspension 30 mL  30 mL Oral Q4H PRN Shuvon Rankin, NP      . benztropine (COGENTIN) tablet 1 mg  1 mg Oral QHS Mojeed Akintayo   1 mg at 07/31/12 2208  . feeding supplement (ENSURE COMPLETE) liquid 237 mL  237 mL Oral BID BM Jeoffrey Massed, RD   237 mL at 08/01/12 0914  . FLUoxetine (PROZAC) capsule 20 mg  20 mg Oral Daily Mojeed Akintayo   20 mg at 08/01/12 0754  . haloperidol (HALDOL) tablet 10 mg  10 mg Oral QHS Mojeed Akintayo   10 mg at 07/31/12 2207  . LORazepam (ATIVAN) tablet 2 mg  2 mg Oral Q4H PRN Mojeed Akintayo   2 mg at 07/31/12 2010  . magnesium hydroxide (MILK OF MAGNESIA) suspension 30 mL  30 mL Oral Daily PRN Shuvon Rankin, NP      . multivitamin with minerals tablet 1 tablet  1 tablet Oral Daily Jeoffrey Massed, RD   1 tablet at 08/01/12 0754  . potassium chloride (K-DUR,KLOR-CON) CR tablet 10 mEq  10 mEq Oral Daily Verne Spurr, PA-C   10 mEq at 08/01/12 1610    Lab Results:  Results for orders placed during the hospital encounter of 07/23/12 (from the past 48 hour(s))  HCG, SERUM, QUALITATIVE     Status: None   Collection Time    07/31/12  7:42 PM      Result Value Range   Preg, Serum NEGATIVE  NEGATIVE   Comment:            THE SENSITIVITY OF THIS      METHODOLOGY IS >10 mIU/mL.  COMPREHENSIVE METABOLIC PANEL     Status: Abnormal   Collection Time    08/01/12  7:59 PM      Result Value Range   Sodium 140  135 - 145 mEq/L   Potassium 3.7  3.5 - 5.1 mEq/L   Chloride 102  96 - 112 mEq/L   CO2 28  19 - 32 mEq/L   Glucose, Bld 110 (*) 70 - 99 mg/dL   BUN 20  6 - 23 mg/dL   Creatinine, Ser 9.60  0.50 - 1.10 mg/dL   Calcium 9.5  8.4 - 45.4 mg/dL   Total Protein 7.6  6.0 - 8.3 g/dL   Albumin 4.0  3.5 - 5.2 g/dL   AST 18  0 - 37 U/L   ALT 13  0 - 35 U/L   Alkaline Phosphatase 55  39 - 117 U/L   Total Bilirubin 0.2 (*) 0.3 - 1.2 mg/dL   GFR calc non Af Amer 70 (*) >90 mL/min   GFR calc Af Amer 81 (*) >90 mL/min   Comment:            The eGFR has been calculated     using the CKD EPI equation.     This calculation has not been     validated in all clinical     situations.     eGFR's persistently     <90 mL/min signify     possible Chronic Kidney Disease.    Physical Findings: AIMS: Facial and Oral Movements Muscles of Facial Expression: None, normal Lips and Perioral Area: None, normal Jaw: None, normal Tongue: None, normal,Extremity Movements Upper (arms, wrists, hands, fingers): None, normal Lower (legs, knees, ankles, toes): None, normal, Trunk Movements Neck, shoulders, hips: None, normal, Overall Severity Severity of abnormal movements (highest score from questions above): None, normal Incapacitation due to abnormal movements: None, normal Patient's awareness of abnormal movements (rate only patient's report): No Awareness, Dental Status Current problems with teeth and/or dentures?: No Does patient usually wear dentures?: No  CIWA:  CIWA-Ar Total: 1 COWS:  COWS Total Score: 2  Treatment Plan Summary: Daily contact with patient to assess and evaluate symptoms and progress in treatment Medication management  Plan:1. Admit for crisis management and stabilization. 2. Medication management to reduce current symptoms to  base line and improve the     patient's overall level of functioning 3. Treat health problems as indicated. 4. Develop treatment plan to decrease risk of relapse upon discharge and the need for  readmission. 5. Psycho-social education regarding relapse prevention and self care. 6. Health care follow up as needed for medical problems. 7. Continue Haldol 10mg  po daily at bedtime for psychosis 8. Continue Cogentin 1mg  po daily at bedtime for EPS prevention 9  continue Prozac 20mg  po daily for depression/anxiety and PTSD symptoms 10. Patient is provided with NIDA information regarding Marijuana and its effects on the brain and body for her to read. 11. Will encourage this patient to reconsider her motivation for leaving the hospital prior to feeling better.  Medical Decision Making Problem Points:  Established problem, stable/improving (1), Review of last therapy session (1) and Review of psycho-social stressors (1) Data Points:  Order Aims Assessment (2) Review of medication regiment & side effects (2) Review of new medications or change in dosage (2)  I certify that inpatient services furnished can reasonably be expected to improve the patient's condition.  Kathryne Sharper, MD  12:46 PM 08/02/2012

## 2012-08-02 NOTE — [facility] Group Notes (Signed)
[facility] Group Notes:  (Clinical Social Work)  08/02/2012  11:15-11:45AM  Summary of Progress/Problems:   The main focus of today's process group was for the patient to identify ways in which they have in the past sabotaged their own recovery and reasons they may have done this/what they received from doing it.  We then worked to identify a specific plan to avoid doing this when discharged from the hospital for this admission.  The patient expressed that she self-sabotages by keeping her feelings bottled in and to herself.  She does this because she does not want to upset other people, or have them overwhelmed with her issues since they already have their own.  She also distrusts people and does not know who is "for" her.  She believes her options going forward are to pray about it, then seek counseling from a professional who is bound to confidentiality.  Type of Therapy:  Group Therapy - Process  Participation Level:  Active  Participation Quality:  Appropriate, Attentive, Sharing and Supportive  Affect:  Blunted  Cognitive:  Alert, Appropriate and Oriented  Insight:  Engaged  Engagement in Therapy:  Engaged  Modes of Intervention:  Clarification, Education, Exploration, Discussion  Ambrose Mantle, LCSW 08/02/2012, 1:13 PM

## 2012-08-02 NOTE — Progress Notes (Signed)
Patient ID: Monica Byrd, female   DOB: October 26, 1985, 27 y.o.   MRN: [id number] D. The patient initially refused scheduled evening blood work, but after the reason was explained, she complied. Visible in day room, but minimal interaction with others.  A. During group was encouraged to speak about what has been helpful with her recovery. Stated that she has learned to keep her issues to herself. " I can listen to what people say to me. I refuse to eat what others are trying to feed me. Some people need to kick others back in order to get ahead. I talk only to God and I'm grateful he has given me another chance." R. Retired to her room and bed after group. Did not want to get out of bed for medications. Brought medication to the patient in her room. Compliant with medication.

## 2012-08-03 MED ORDER — HALOPERIDOL 5 MG PO TABS
15.0000 mg | ORAL_TABLET | Freq: Every day | ORAL | Status: DC
  Administered 2012-08-03: 15 mg via ORAL
  Filled 2012-08-03 (×2): qty 3

## 2012-08-03 NOTE — Progress Notes (Signed)
Patient ID: Monica Byrd, female   DOB: 10-21-1985, 27 y.o.   MRN: [id number] [facility] MD Progress Note  08/03/2012 9:25 AM STEPH CHEADLE  MRN:  [id number] Subjective: "I am hearing voices "  Objective: Patient seen and chart review.  Patient continued to have auditory hallucinations calling her bad names .  She continued to experience psychosis paranoia delusions and having passive suicidal thinking.  She does not have any plan however she does not feed safe either.  She endorse voicing telling me to do bad things.  Patient does not like these bad thoughts and hallucinations.  She feels the medicines are working but may not be strong enough.  Patient is minimally involved in the groups and limited verbalization.  She has a tremors or shakes.  There has been no management problem in the unit.  She still requires encouragement to take medication.    Diagnosis:  Axis I: PTSD, schizoaffective disorder                               Cannabis use disorder. Drug induced psychosis. ADL's:  Intact  Sleep: fair  Appetite:  Fair  Suicidal Ideation:  Plan:  denies Intent:  denies Means:  denies Homicidal Ideation:  Plan:  denies Intent:  denies Means:  denies AEB (as evidenced by):  Psychiatric Specialty Exam: Review of Systems  Constitutional: Negative.   HENT: Negative.   Eyes: Negative.   Respiratory: Negative.   Cardiovascular: Negative.   Gastrointestinal: Negative.   Genitourinary: Negative.   Musculoskeletal: Negative.   Skin: Negative.   Neurological: Negative.   Endo/Heme/Allergies: Negative.   Psychiatric/Behavioral: Positive for depression, suicidal ideas, hallucinations and substance abuse. The patient is nervous/anxious and has insomnia.        Paranoia    Blood pressure 111/72, pulse 86, temperature 98.2 F (36.8 C), temperature source Oral, resp. rate 18, height 5\' 3"  (1.6 m), weight 62.596 kg (138 lb), last menstrual period 07/15/2012.Body mass index is 24.45  kg/(m^2).  General Appearance: Fairly Groomed  Patent attorney::  Poor  Speech:  Slow  Volume:  Decreased  Mood:  Anxious and Depressed  Affect:  Blunt  Thought Process:  Disorganized  Orientation:  Full (Time, Place, and Person)  Thought Content:  Hallucinations: Auditory and Paranoid Ideation  Suicidal Thoughts:  No  Homicidal Thoughts:  No  Memory:  Immediate;   Fair Recent;   Fair Remote;   Poor  Judgement:  Poor  Insight:  Lacking  Psychomotor Activity:  Decreased  Concentration:  Poor  Recall:  Poor  Akathisia:  No  Handed:  Right  AIMS (if indicated):     Assets:  Desire for Improvement Housing Others:  employed  Sleep:  Number of Hours: 6   Current Medications: Current Facility-Administered Medications  Medication Dose Route Frequency Provider Last Rate Last Dose  . acetaminophen (TYLENOL) tablet 650 mg  650 mg Oral Q6H PRN Shuvon Rankin, NP   650 mg at 07/27/12 1303  . alum & mag hydroxide-simeth (MAALOX/MYLANTA) 200-200-20 MG/5ML suspension 30 mL  30 mL Oral Q4H PRN Shuvon Rankin, NP      . benztropine (COGENTIN) tablet 1 mg  1 mg Oral QHS Mojeed Akintayo   1 mg at 07/31/12 2208  . feeding supplement (ENSURE COMPLETE) liquid 237 mL  237 mL Oral BID BM Jeoffrey Massed, RD   237 mL at 08/01/12 0914  . FLUoxetine (PROZAC) capsule 20 mg  20 mg Oral Daily Mojeed Akintayo   20 mg at 08/01/12 0754  . haloperidol (HALDOL) tablet 10 mg  10 mg Oral QHS Mojeed Akintayo   10 mg at 07/31/12 2207  . LORazepam (ATIVAN) tablet 2 mg  2 mg Oral Q4H PRN Mojeed Akintayo   2 mg at 07/31/12 2010  . magnesium hydroxide (MILK OF MAGNESIA) suspension 30 mL  30 mL Oral Daily PRN Shuvon Rankin, NP      . multivitamin with minerals tablet 1 tablet  1 tablet Oral Daily Jeoffrey Massed, RD   1 tablet at 08/01/12 0754  . potassium chloride (K-DUR,KLOR-CON) CR tablet 10 mEq  10 mEq Oral Daily Verne Spurr, PA-C   10 mEq at 08/01/12 1610    Lab Results:  Results for orders placed during the hospital  encounter of 07/23/12 (from the past 48 hour(s))  COMPREHENSIVE METABOLIC PANEL     Status: Abnormal   Collection Time    08/01/12  7:59 PM      Result Value Range   Sodium 140  135 - 145 mEq/L   Potassium 3.7  3.5 - 5.1 mEq/L   Chloride 102  96 - 112 mEq/L   CO2 28  19 - 32 mEq/L   Glucose, Bld 110 (*) 70 - 99 mg/dL   BUN 20  6 - 23 mg/dL   Creatinine, Ser 9.60  0.50 - 1.10 mg/dL   Calcium 9.5  8.4 - 45.4 mg/dL   Total Protein 7.6  6.0 - 8.3 g/dL   Albumin 4.0  3.5 - 5.2 g/dL   AST 18  0 - 37 U/L   ALT 13  0 - 35 U/L   Alkaline Phosphatase 55  39 - 117 U/L   Total Bilirubin 0.2 (*) 0.3 - 1.2 mg/dL   GFR calc non Af Amer 70 (*) >90 mL/min   GFR calc Af Amer 81 (*) >90 mL/min   Comment:            The eGFR has been calculated     using the CKD EPI equation.     This calculation has not been     validated in all clinical     situations.     eGFR's persistently     <90 mL/min signify     possible Chronic Kidney Disease.    Physical Findings: AIMS: Facial and Oral Movements Muscles of Facial Expression: None, normal Lips and Perioral Area: None, normal Jaw: None, normal Tongue: None, normal,Extremity Movements Upper (arms, wrists, hands, fingers): None, normal Lower (legs, knees, ankles, toes): None, normal, Trunk Movements Neck, shoulders, hips: None, normal, Overall Severity Severity of abnormal movements (highest score from questions above): None, normal Incapacitation due to abnormal movements: None, normal Patient's awareness of abnormal movements (rate only patient's report): No Awareness, Dental Status Current problems with teeth and/or dentures?: No Does patient usually wear dentures?: No  CIWA:  CIWA-Ar Total: 1 COWS:  COWS Total Score: 2  Treatment Plan Summary: Daily contact with patient to assess and evaluate symptoms and progress in treatment Medication management  Plan:1. Admit for crisis management and stabilization. 2. Medication management to reduce  current symptoms to base line and improve the     patient's overall level of functioning 3. Treat health problems as indicated. 4. Develop treatment plan to decrease risk of relapse upon discharge and the need for     readmission. 5. Psycho-social education regarding relapse prevention and self care. 6. Health care follow up as  needed for medical problems. 7. Will increase Haldol to 15 mg at bedtime for psychosis 8. Continue Cogentin 1mg  po daily at bedtime for EPS prevention 9  continue Prozac 20mg  po daily for depression/anxiety and PTSD symptoms 10. Patient is provided with NIDA information regarding Marijuana and its effects on the brain and body for her to read. 11. Will encourage this patient to reconsider her motivation for leaving the hospital prior to feeling better.  Medical Decision Making Problem Points:  Established problem, stable/improving (1), Established problem, worsening (2), Review of last therapy session (1) and Review of psycho-social stressors (1) Data Points:  Order Aims Assessment (2) Review of medication regiment & side effects (2) Review of new medications or change in dosage (2)  I certify that inpatient services furnished can reasonably be expected to improve the patient's condition.  Kathryne Sharper, MD  9:25 AM 08/03/2012

## 2012-08-03 NOTE — Progress Notes (Signed)
Patient ID: Monica Byrd, female   DOB: 1985-12-20, 27 y.o.   MRN: [id number] Psychoeducational Group Note  Date:  08/03/2012 Time:  1000am  Group Topic/Focus:  Making Healthy Choices:   The focus of this group is to help patients identify negative/unhealthy choices they were using prior to admission and identify positive/healthier coping strategies to replace them upon discharge.  Participation Level:  Did Not Attend  Participation Quality:    Affect:  Cognitive:  Insight:  Engagement in Group:   Additional Comments:  Spiritual group   Valente David 08/03/2012,11:26 AM

## 2012-08-03 NOTE — Progress Notes (Signed)
Patient ID: Monica Byrd, female   DOB: Nov 12, 1985, 27 y.o.   MRN: [id number] Psychoeducational Group Note  Date:  08/03/2012 Time:  1000am  Group Topic/Focus:  Making Healthy Choices:   The focus of this group is to help patients identify negative/unhealthy choices they were using prior to admission and identify positive/healthier coping strategies to replace them upon discharge.  Participation Level:  Active  Participation Quality:  Redirectable  Affect:  Flat  Cognitive:  Lacking  Insight:  Limited  Engagement in Group:  Limited  Additional Comments:  Inventory and Psychoeducational group   Valente David 08/03/2012,10:08 AM

## 2012-08-03 NOTE — Progress Notes (Signed)
Patient ID: Monica Byrd, female   DOB: 1985-11-27, 27 y.o.   MRN: [id number] D. The patient still has a flat affect and depressed mood, but she is interacting more in the milieu. Part of her recovery plan she set for herself is to try not to dwell on the negatives in her life and focus on the positives. She named her son and her mother as positives in her life. Stated she doesn't want to take medicine, but realizes her thinking is not right and needs to take it if she is going to get better. A. Met with patient and her mother. Verbal support and praise given for her efforts towards working on a recovery plan. Encouraged to attend evening wrap up group. Reviewed and administered HS medication. R. Attended and actively participated in evening wrap up group. Stated she enjoyed going outside today and listening to music. Denied a/v hallucinations.

## 2012-08-03 NOTE — Progress Notes (Signed)
D.  Pt. Reports that she is "trying my best to get better."  Reports hearing voices and states that she thought it was the holy spirit.  Voices are telling her that "I can't trust any body."  Pt. Denies SI/HI and contracts for safety. A.  Encouragement and support given  . Q  15 min. Checks for safety. R.  Pt. Remains safe.

## 2012-08-03 NOTE — [facility] Group Notes (Signed)
[facility] Group Notes:  (Clinical Social Work)  08/03/2012   11:15-11:45AM  Summary of Progress/Problems:  The main focus of today's process group was to listen to a variety of genres of music and to identify that different types of music provoke different responses.  The patient then was able to identify personally what was soothing for them, as well as energizing.  Handouts were used to record feelings evoked, as well as how patient can personally use this knowledge in sleep habits, with depression, and with other symptoms.  The patient expressed general understanding, some unclear thinking.  Type of Therapy:  Music Therapy   Participation Level:  Active  Participation Quality:  Attentive  Affect:  Depressed and Flat  Cognitive:  Oriented  Insight:  Developing/Improving  Engagement in Therapy:  Engaged  Modes of Intervention:   Activity, Exploration  Monica Mantle, LCSW 08/03/2012, 1:12 PM

## 2012-08-04 MED ORDER — HALOPERIDOL 2 MG PO TABS
2.0000 mg | ORAL_TABLET | Freq: Once | ORAL | Status: AC
  Filled 2012-08-04: qty 1

## 2012-08-04 MED ORDER — DIPHENHYDRAMINE HCL 50 MG PO CAPS
50.0000 mg | ORAL_CAPSULE | Freq: Once | ORAL | Status: AC
  Administered 2012-08-04: 50 mg via ORAL

## 2012-08-04 MED ORDER — HALOPERIDOL 1 MG PO TABS
ORAL_TABLET | ORAL | Status: AC
  Administered 2012-08-04: 2 mg via ORAL
  Filled 2012-08-04: qty 2

## 2012-08-04 MED ORDER — BENZTROPINE MESYLATE 0.5 MG PO TABS
0.5000 mg | ORAL_TABLET | Freq: Two times a day (BID) | ORAL | Status: DC
  Administered 2012-08-04 – 2012-08-05 (×2): 0.5 mg via ORAL
  Filled 2012-08-04 (×6): qty 1

## 2012-08-04 MED ORDER — RISPERIDONE 2 MG PO TBDP
2.0000 mg | ORAL_TABLET | Freq: Two times a day (BID) | ORAL | Status: DC
  Administered 2012-08-04 – 2012-08-05 (×2): 2 mg via ORAL
  Filled 2012-08-04 (×6): qty 1

## 2012-08-04 NOTE — Progress Notes (Addendum)
D: Patient in the dayroom on approach.  Patient states she did not have a good day.  Patient states everybody keeps treating her like a child.  Patient will not states who she is talking about.  When asked by writer who she is speaking of patient states,"Everybody."  Patient paranoid and appears to be having auditory hallucinations but does not admit to it.  Patient verbally contracts for safety.  Patient denies SI/HI and denies AVH. A: Staff to monitor Q 15 mins for safety.  Encouragement and support offered.  Scheduled medications administered per orders. R: Patient remains safe on the unit.  Patient attended group tonight.  Patient calm and cooperative.  Patient taking administered medications.  Patient visible on the unit and interacting with peers.

## 2012-08-04 NOTE — Progress Notes (Signed)
Recreation Therapy Notes  Date: 06.02.2014 Time: 9:30am Location: [address] Dayroom      Group Topic/Focus: Goal Setting  Participation Level: Active  Participation Quality: Appropriate and Intrusive  Affect: Flat  Cognitive: Oriented  Additional Comments: Activity: Bucket List ; Explanation: Patients were asked to make a list of things they can do in the next year to keep themselves well. Meditation music was played in the background to enhance therapeutic environment.  Patient entered group space approximately 5 minutes late. Patient walked in very slowly, holding a notebook close to her body. Patient stated she understood the directions of group activity. Patient wrote words "calming" "relaxation" and "ind[place] in [place]" on her paper. Patient wrote using two markers at the same time. LRT asked if those were her emotions towards the music played. Patient responded "yes." Approximately half way through group session patient asked LRT what to do if someone was upset with her, but she did not do anything to upset her. LRT explained sometimes in life that we upset people without meaning to and it is ok to talk to the person about it. Patein then stated "Well can you ask him (patient pointed to female peer) what his problem is." Female peer stated he has no issue with patient. Patietn stated "it's just a feeling I get." Patient went on to state that she doesn't know why she feels like she has upset her female peer, she just does. Peer assured patient he is not upset or angry with her. Patient appeared to accept this, however she remained flat and appeared unsure about accepting this. Patient remained in group session without further incident. As LRT was wrapping up group patient stood near the door, waiting to leave group session. Patient waited for LRT to open door before she exited space, nothing was preventing patient from opening the door herself, however patient waited for LRT to open door prior  to leaving.   Marykay Lex Kalon Erhardt, LRT/CTRS  Jermond Burkemper L 08/04/2012 10:19 AM

## 2012-08-04 NOTE — Progress Notes (Signed)
D:  Patient up and present in the milieu much of the day.  Continues to have minimal interaction with staff or peers.  She is taking her medications as prescribed.  Has attended some groups today, or at least parts of groups.  Continues to have blunted expression.  She denies depression, hopelessness, or self harm thoughts on her self inventory form.  Has an episode this morning where she was complaining of throat pain, she then started screaming and stating she wanted the demons to go away.  A:  Medications given as prescribed.  Encouraged patient to attend all groups and to be more interactive with her peers.  Educated about changes in her medications.  She was given a one time dose of Ativan 2 mg, Haldol 2 mg, and Benadryl 50 mg PO per request of Lloyd Huger, [place], for possible allergic reaction vs. severe anxiety or panic attack.   R:  Quiet and minimally interactive.  Medications effective.  Guarded in her interactions.  Remains safe on the unit.

## 2012-08-04 NOTE — Progress Notes (Addendum)
Patient ID: Monica Byrd, female   DOB: 07-Feb-1986, 27 y.o.   MRN: [id number] [facility] MD Progress Note  08/04/2012 9:31 AM Monica Byrd  MRN:  [id number] Subjective: "Make it stop!"   Objective: Patient seen and chart review.  Patient continued to have auditory hallucinations calling her bad names .  She continued to experience psychosis paranoia delusions and having passive suicidal thinking. [name] had to leave group this morning due to paranoia. She presented to med window crying and complaining of reflux symptoms.  She was evaluated and found to be responding to internal stimulation, no evidence of dystonic reaction, but extremely paranoid.  She was given 2mg  po of Haldol, 2mg  of Ativan, Benadryl 50mg  po and Maalox for her reflux symptoms. She became drowsy and went to sleep. She felt better upon awakening.  Diagnosis:  Axis I: PTSD, schizoaffective disorder                               Cannabis use disorder. Drug induced psychosis. ADL's:  Intact  Sleep: fair  Appetite:  Fair  Suicidal Ideation:  Plan:  denies Intent:  denies Means:  denies Homicidal Ideation:  Plan:  denies Intent:  denies Means:  denies AEB (as evidenced by):  Psychiatric Specialty Exam: Review of Systems  Constitutional: Negative.  Negative for fever, chills, weight loss, malaise/fatigue and diaphoresis.  HENT: Negative.  Negative for congestion and sore throat.   Eyes: Negative.  Negative for blurred vision, double vision and photophobia.  Respiratory: Negative.  Negative for cough, shortness of breath and wheezing.   Cardiovascular: Negative.  Negative for chest pain, palpitations and PND.  Gastrointestinal: Negative.  Negative for heartburn, nausea, vomiting, abdominal pain, diarrhea and constipation.  Genitourinary: Negative.   Musculoskeletal: Negative.  Negative for myalgias, joint pain and falls.  Skin: Negative.   Neurological: Negative.  Negative for dizziness, tingling, tremors, sensory  change, speech change, focal weakness, seizures, loss of consciousness, weakness and headaches.  Endo/Heme/Allergies: Negative.  Negative for polydipsia. Does not bruise/bleed easily.  Psychiatric/Behavioral: Positive for depression, suicidal ideas, hallucinations and substance abuse. Negative for memory loss. The patient is nervous/anxious and has insomnia.        Paranoia    Blood pressure 102/69, pulse 93, temperature 98.3 F (36.8 C), temperature source Oral, resp. rate 20, height 5\' 3"  (1.6 m), weight 62.596 kg (138 lb), last menstrual period 07/15/2012.Body mass index is 24.45 kg/(m^2).  General Appearance: Fairly Groomed  Patent attorney::  Poor  Speech:  Slow  Volume:  Decreased  Mood:  Anxious and Depressed  Affect: flat and tearful  Thought Process:  Disorganized  Orientation:  Full (Time, Place, and Person)  Thought Content:  Hallucinations: Auditory and Paranoid Ideation  Suicidal Thoughts:  No  Homicidal Thoughts:  No  Memory:  Immediate;   Fair Recent;   Fair Remote;   Poor  Judgement:  Poor  Insight:  Lacking  Psychomotor Activity:  Decreased  Concentration:  Poor  Recall:  Poor  Akathisia:  No  Handed:  Right  AIMS (if indicated):     Assets:  Desire for Improvement Housing Others:  employed  Sleep:  Number of Hours: 6.75   Current Medications: Current Facility-Administered Medications  Medication Dose Route Frequency Provider Last Rate Last Dose  . acetaminophen (TYLENOL) tablet 650 mg  650 mg Oral Q6H PRN Shuvon Rankin, NP   650 mg at 07/27/12 1303  . alum & mag  hydroxide-simeth (MAALOX/MYLANTA) 200-200-20 MG/5ML suspension 30 mL  30 mL Oral Q4H PRN Shuvon Rankin, NP      . benztropine (COGENTIN) tablet 1 mg  1 mg Oral QHS Mojeed Akintayo   1 mg at 07/31/12 2208  . feeding supplement (ENSURE COMPLETE) liquid 237 mL  237 mL Oral BID BM Jeoffrey Massed, RD   237 mL at 08/01/12 0914  . FLUoxetine (PROZAC) capsule 20 mg  20 mg Oral Daily Mojeed Akintayo   20 mg at  08/01/12 0754  . haloperidol (HALDOL) tablet 10 mg  10 mg Oral QHS Mojeed Akintayo   10 mg at 07/31/12 2207  . LORazepam (ATIVAN) tablet 2 mg  2 mg Oral Q4H PRN Mojeed Akintayo   2 mg at 07/31/12 2010  . magnesium hydroxide (MILK OF MAGNESIA) suspension 30 mL  30 mL Oral Daily PRN Shuvon Rankin, NP      . multivitamin with minerals tablet 1 tablet  1 tablet Oral Daily Jeoffrey Massed, RD   1 tablet at 08/01/12 0754  . potassium chloride (K-DUR,KLOR-CON) CR tablet 10 mEq  10 mEq Oral Daily Verne Spurr, PA-C   10 mEq at 08/01/12 2595    Lab Results:  No results found for this or any previous visit (from the past 48 hour(s)).  Physical Findings: AIMS: Facial and Oral Movements Muscles of Facial Expression: None, normal Lips and Perioral Area: None, normal Jaw: None, normal Tongue: None, normal,Extremity Movements Upper (arms, wrists, hands, fingers): None, normal Lower (legs, knees, ankles, toes): None, normal, Trunk Movements Neck, shoulders, hips: None, normal, Overall Severity Severity of abnormal movements (highest score from questions above): None, normal Incapacitation due to abnormal movements: None, normal Patient's awareness of abnormal movements (rate only patient's report): No Awareness, Dental Status Current problems with teeth and/or dentures?: No Does patient usually wear dentures?: No  CIWA:  CIWA-Ar Total: 1 COWS:  COWS Total Score: 2  Treatment Plan Summary: Daily contact with patient to assess and evaluate symptoms and progress in treatment Medication management  Plan:1. Admit for crisis management and stabilization. 2. Medication management to reduce current symptoms to base line and improve the     patient's overall level of functioning 3. Treat health problems as indicated. 4. Develop treatment plan to decrease risk of relapse upon discharge and the need for     readmission. 5. Psycho-social education regarding relapse prevention and self care. 6. Health care  follow up as needed for medical problems. 7. Will discontinue Haldol to 15 mg at bedtime for psychosis due to poor progress. 8. change Cogentin to 0.5mg  po BID daily at bedtime for EPS prevention 9  continue Prozac 20mg  po daily for depression/anxiety and PTSD symptoms 10. Patient is provided with NIDA information regarding Marijuana and its effects on the brain and body for her to read. 11. Will encourage this patient to reconsider her motivation for leaving the hospital prior to feeling better. 12. After discussion with the MD this patient will be started on Risperdal 2mg  po BID and Cogentin will be changed to 0.5 mg po BID to coincide with the risperdal. Medical Decision Making Problem Points:  Established problem, stable/improving (1), Established problem, worsening (2), Review of last therapy session (1) and Review of psycho-social stressors (1) Data Points:  Order Aims Assessment (2) Review of medication regiment & side effects (2) Review of new medications or change in dosage (2) Did discuss this with the patient and the patient's mother and both were given the opportunity to  ask questions regarding this decision and the reason to hold off on her discharge at this time. I certify that inpatient services furnished can reasonably be expected to improve the patient's condition.  Rona Ravens. Tifanny Dollens RPAC 2:24 PM 08/04/2012

## 2012-08-04 NOTE — Progress Notes (Signed)
Patient ID: Monica Byrd, female   DOB: [birth year], 27 y.o.   MRN: [id number] D. The patient is pleasant and more accepting of treatment. When asked about her recovery plan, she was able to verbalize her need for medication and out patient therapy. A. Met with patient 1:1 to assess. Given verbal praise and support regarding her progress. Encouraged to attend evening group. Reviewed medication changes and administered medication. R. Attended and actively participated in evening group. Interacting with select peers in milieu. Compliant with medications.

## 2012-08-04 NOTE — Clinical Social Work Note (Signed)
  Type of Therapy: Process Group Therapy  Participation Level:  Active  Participation Quality:  Attentive  Affect:  Flat  Cognitive:  Oriented  Insight:  Improving  Engagement in Group:  Engaged  Engagement in Therapy:  Improving  Modes of Intervention:  Activity, Clarification, Education, Problem-solving and Support  Summary of Progress/Problems: Today's group addressed the issue of overcoming obstacles.  Patients were asked to identify their biggest obstacle post d/c that stands in the way of their on-going success, and then problem solve as to how to manage this. This was the first group that Sunaina was actually able to participate in a meaningful way.  She was spontaneous with her responses, was not guarded and suspicious and did not appear to be responding to internal stimuli.  She was also able to abstract from others' stories to her own.  She identified needing to be more guarded and secretive about her private life to co-workers as her biggest obstacle.  She shared that she was posting too much information on facebook that gave others information, which lead to an interesting discussion in group about social media.       Daryel Gerald B 08/04/2012   4:03 PM

## 2012-08-04 NOTE — [facility] Group Notes (Signed)
[name] Aftercare Discharge Planning Group Note   08/04/2012 4:10 PM  Participation Quality:  Attended briefly after walking in with hands around her throat and an an larmed look on her face.  Could not tell me what was going on.  Asked to be excused after 5 minutes, explaining that she was feeling unsafe.    Monica Byrd

## 2012-08-05 MED ORDER — BENZTROPINE MESYLATE 0.5 MG PO TABS
0.5000 mg | ORAL_TABLET | Freq: Two times a day (BID) | ORAL | Status: DC
  Administered 2012-08-05 – 2012-08-06 (×2): 0.5 mg via ORAL
  Filled 2012-08-05 (×4): qty 1

## 2012-08-05 MED ORDER — RISPERIDONE 2 MG PO TBDP
2.0000 mg | ORAL_TABLET | Freq: Two times a day (BID) | ORAL | Status: DC
  Administered 2012-08-05 – 2012-08-06 (×2): 2 mg via ORAL
  Filled 2012-08-05 (×4): qty 1

## 2012-08-05 NOTE — Progress Notes (Signed)
Patient ID: Monica Byrd, female   DOB: 10/02/1985, 27 y.o.   MRN: [id number] [facility] MD Progress Note  08/05/2012 10:32 AM Monica Byrd  MRN:  [id number] Subjective: "I am doing much better today, except for feeling paranoid"  Objective: Patient reports decreased anxiety, edgy feeling and auditory hallucinations. However, she continued to experience  paranoia delusion. Patient is now compliant with her medications and has not reported any adverse reactions to it. Patient is looking to be discharged soon so the she can get back to her life.    Diagnosis:  Axis I: PTSD, schizoaffective disorder                               Cannabis use disorder. Drug induced psychosis. ADL's:  Intact  Sleep: fair  Appetite:  Fair  Suicidal Ideation:  Plan:  denies Intent:  denies Means:  denies Homicidal Ideation:  Plan:  denies Intent:  denies Means:  denies AEB (as evidenced by):  Psychiatric Specialty Exam: Review of Systems  Constitutional: Negative.  Negative for fever, chills, weight loss, malaise/fatigue and diaphoresis.  HENT: Negative.  Negative for congestion and sore throat.   Eyes: Negative.  Negative for blurred vision, double vision and photophobia.  Respiratory: Negative.  Negative for cough, shortness of breath and wheezing.   Cardiovascular: Negative.  Negative for chest pain, palpitations and PND.  Gastrointestinal: Negative.  Negative for heartburn, nausea, vomiting, abdominal pain, diarrhea and constipation.  Genitourinary: Negative.   Musculoskeletal: Negative.  Negative for myalgias, joint pain and falls.  Skin: Negative.   Neurological: Negative.  Negative for dizziness, tingling, tremors, sensory change, speech change, focal weakness, seizures, loss of consciousness, weakness and headaches.  Endo/Heme/Allergies: Negative.  Negative for polydipsia. Does not bruise/bleed easily.  Psychiatric/Behavioral: Positive for depression. Negative for memory loss. The patient is  nervous/anxious.        Paranoia    Blood pressure 97/67, pulse 106, temperature 97.9 F (36.6 C), temperature source Oral, resp. rate 20, height 5\' 3"  (1.6 m), weight 62.596 kg (138 lb), last menstrual period 07/15/2012.Body mass index is 24.45 kg/(m^2).  General Appearance: Fairly Groomed  Patent attorney::  Poor  Speech:  Slow  Volume:  Decreased  Mood:  Anxious and Depressed  Affect: flat and tearful  Thought Process:  Disorganized  Orientation:  Full (Time, Place, and Person)  Thought Content:  Hallucinations: Auditory and Paranoid Ideation  Suicidal Thoughts:  No  Homicidal Thoughts:  No  Memory:  Immediate;   Fair Recent;   Fair Remote;   Poor  Judgement:  Poor  Insight:  Lacking  Psychomotor Activity:  Decreased  Concentration:  Poor  Recall:  Poor  Akathisia:  No  Handed:  Right  AIMS (if indicated):     Assets:  Desire for Improvement Housing Others:  employed  Sleep:  Number of Hours: 6   Current Medications: Current Facility-Administered Medications  Medication Dose Route Frequency Provider Last Rate Last Dose  . acetaminophen (TYLENOL) tablet 650 mg  650 mg Oral Q6H PRN Shuvon Rankin, NP   650 mg at 07/27/12 1303  . alum & mag hydroxide-simeth (MAALOX/MYLANTA) 200-200-20 MG/5ML suspension 30 mL  30 mL Oral Q4H PRN Shuvon Rankin, NP      . benztropine (COGENTIN) tablet 1 mg  1 mg Oral QHS Sandralee Tarkington   1 mg at 07/31/12 2208  . feeding supplement (ENSURE COMPLETE) liquid 237 mL  237 mL Oral BID  BM Jeoffrey Massed, RD   237 mL at 08/01/12 0914  . FLUoxetine (PROZAC) capsule 20 mg  20 mg Oral Daily Nafeesah Lapaglia   20 mg at 08/01/12 0754  . haloperidol (HALDOL) tablet 10 mg  10 mg Oral QHS Jenniger Figiel   10 mg at 07/31/12 2207  . LORazepam (ATIVAN) tablet 2 mg  2 mg Oral Q4H PRN Zykeria Laguardia   2 mg at 07/31/12 2010  . magnesium hydroxide (MILK OF MAGNESIA) suspension 30 mL  30 mL Oral Daily PRN Shuvon Rankin, NP      . multivitamin with minerals tablet 1  tablet  1 tablet Oral Daily Jeoffrey Massed, RD   1 tablet at 08/01/12 0754  . potassium chloride (K-DUR,KLOR-CON) CR tablet 10 mEq  10 mEq Oral Daily Verne Spurr, PA-C   10 mEq at 08/01/12 1610    Lab Results:  No results found for this or any previous visit (from the past 48 hour(s)).  Physical Findings: AIMS: Facial and Oral Movements Muscles of Facial Expression: None, normal Lips and Perioral Area: None, normal Jaw: None, normal Tongue: None, normal,Extremity Movements Upper (arms, wrists, hands, fingers): None, normal Lower (legs, knees, ankles, toes): None, normal, Trunk Movements Neck, shoulders, hips: None, normal, Overall Severity Severity of abnormal movements (highest score from questions above): None, normal Incapacitation due to abnormal movements: None, normal Patient's awareness of abnormal movements (rate only patient's report): No Awareness, Dental Status Current problems with teeth and/or dentures?: No Does patient usually wear dentures?: No  CIWA:  CIWA-Ar Total: 1 COWS:  COWS Total Score: 2  Treatment Plan Summary: Daily contact with patient to assess and evaluate symptoms and progress in treatment Medication management  Plan:1. Admit for crisis management and stabilization. 2. Medication management to reduce current symptoms to base line and improve the     patient's overall level of functioning 3. Treat health problems as indicated. 4. Develop treatment plan to decrease risk of relapse upon discharge and the need for     readmission. 5. Psycho-social education regarding relapse prevention and self care. 6. Health care follow up as needed for medical problems. 7. Will discontinue Haldol to 15 mg at bedtime for psychosis due to poor progress. 8. Change Cogentin to 0.5mg  po BID daily at bedtime for EPS prevention 9  Continue Prozac 20mg  po daily for depression/anxiety and PTSD symptoms 10. Continue  Risperdal 2mg  po BID for delusion and psychosis.  Medical  Decision Making Problem Points:  Established problem, stable/improving (1), Established problem, worsening (2), Review of last therapy session (1) and Review of psycho-social stressors (1) Data Points:  Order Aims Assessment (2) Review of medication regiment & side effects (2) Review of new medications or change in dosage (2) Did discuss this with the patient and the patient's mother and both were given the opportunity to ask questions regarding this decision and the reason to hold off on her discharge at this time.  I certify that inpatient services furnished can reasonably be expected to improve the patient's condition.  Thedore Mins, MD 10:32 AM 08/05/2012

## 2012-08-05 NOTE — Progress Notes (Signed)
Adult Psychoeducational Group Note  Date:  08/05/2012 Time:  10:30 AM  Group Topic/Focus:  Recovery Goals:   The focus of this group is to identify appropriate goals for recovery and establish a plan to achieve them.  Participation Level:  Did Not Attend  Participation Quality:  Did not attend group  Affect:  Did not attend group   Cognitive:  did not attend group  Insight: None  Engagement in Group:  None  Modes of Intervention:  Discussion, Education and Support  Additional Comments:  [name] did not attend group   Nichola Sizer 08/05/2012, 10:30 AM

## 2012-08-05 NOTE — Progress Notes (Signed)
D: Patient in the dayroom on approach.  Writer observed patient dancing and singing.  Patient appears brighter.  Patient states, "I feel a whole lot better."  Patient states she is ready to be discharged tomorrow.  Patient still appears paranoid.  Patient wanted to know why the writer was asking so many questions.  Patient denies SI/HI and denies AVH.  Patient states i have learned not to let the comments get to me.  Patient verbally contracts for safety.   A: Staff to monitor Q 15 mins for safety.  Encouragement and support offered.  Scheduled medications administered per orders. R: Patient remains safe on the unit.  Patient attended group tonight.  Patient calm, cooperative and taking administered medications.  Patient visible on the unit and interacting with peers.

## 2012-08-05 NOTE — Progress Notes (Signed)
D:  Patient up and present in the milieu today.  Has been smiling more and interacting more with her peers.  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  Wants to discharge tomorrow.  Upset this afternoon after talking with her child's father.  After speaking with her mother she was better.  She thought that he was not going to allow her to have the child back.  According to the mother Monica Byrd cannot have the child without supervision at this time.   A:  Spoke with patient and her mother with patients permission.  Explained that patient would be discharged tomorrow, but was concerned about her son and the custody issue.  Mother filled me in on what has gone on with CPS and informed Monica Byrd in my presence that she was not allowed to have her son without supervision.  Encouraged patient to verbalize her feelings.   R:  Patient receptive.  States she understands she cannot pick up her son tomorrow and states she feels comfortable with him being with his father.  Patient has been smiling more today and states that she is happy to be leaving tomorrow.  Safety is maintained.

## 2012-08-06 MED ORDER — BENZTROPINE MESYLATE 0.5 MG PO TABS: 0.5000 mg | ORAL_TABLET | Freq: Two times a day (BID) | ORAL | Status: DC

## 2012-08-06 MED ORDER — FLUOXETINE HCL 20 MG PO CAPS: 20.0000 mg | ORAL_CAPSULE | Freq: Every day | ORAL | Status: DC

## 2012-08-06 MED ORDER — RISPERIDONE 2 MG PO TBDP: 2.0000 mg | ORAL_TABLET | Freq: Two times a day (BID) | ORAL | Status: DC

## 2012-08-06 NOTE — [facility] Suicide Risk Assessment (Signed)
[facility] INPATIENT:  Family/Significant Other Suicide Prevention Education  Suicide Prevention Education:  Education Completed; No one has been identified by the patient as the family member/significant other with whom the patient will be residing, and identified as the person(s) who will aid the patient in the event of a mental health crisis (suicidal ideations/suicide attempt).  With written consent from the patient, the family member/significant other has been provided the following suicide prevention education, prior to the and/or following the discharge of the patient.  The suicide prevention education provided includes the following:  Suicide risk factors  Suicide prevention and interventions  National Suicide Hotline telephone number  [facility] assessment telephone number  [facility] Emergency Assistance 911  [facility] and/or Residential Mobile Crisis Unit telephone number  Request made of family/significant other to:  Remove weapons (e.g., guns, rifles, knives), all items previously/currently identified as safety concern.    Remove drugs/medications (over-the-counter, prescriptions, illicit drugs), all items previously/currently identified as a safety concern.  The family member/significant other verbalizes understanding of the suicide prevention education information provided.  The family member/significant other agrees to remove the items of safety concern listed above.  Staci did not endorse SI at admission, nor did she c/o SI during her stay.  SPE not required.   Daryel Gerald B 08/06/2012, 11:39 AM

## 2012-08-06 NOTE — Progress Notes (Signed)
[facility] Adult Case Management Discharge Plan :  Will you be returning to the same living situation after discharge: Yes,  home At discharge, do you have transportation home?:Yes,  family Do you have the ability to pay for your medications:Yes,  mental health  Release of information consent forms completed and in the chart;  Patient's signature needed at discharge.  Patient to Follow up at: Follow-up Information   Follow up with [facility] On 08/12/2012. (Go to the walk-in clinic between 8:30 and 9:30 for hospital after care appointment)    Contact information:   [address]  [place]  [phone]      Follow up with [facility] On 08/11/2012. (10:00 with Aquilla Hacker for counseling)    Contact information:   [address]  [place]  [336] [phone]      Patient denies SI/HI:   Yes,  yes    Safety Planning and Suicide Prevention discussed:  Yes,  yes  Ida Rogue 08/06/2012, 11:42 AM

## 2012-08-06 NOTE — Discharge Summary (Signed)
Physician Discharge Summary Note  Patient:  Monica Byrd is an 27 y.o., female MRN:  [id number] DOB:  08-29-1985 Patient phone:  [phone] (home)  Patient address:   [address] [place] [address],   Date of Admission:  07/23/2012 Date of Discharge: 08/06/2012  Reason for Admission: psychosis  Discharge Diagnoses: Principal Problem:   Schizoaffective disorder Active Problems:   Posttraumatic stress disorder  Review of Systems  Constitutional: Negative.  Negative for fever, chills, weight loss, malaise/fatigue and diaphoresis.  HENT: Negative for congestion and sore throat.   Eyes: Negative for blurred vision, double vision and photophobia.  Respiratory: Negative for cough, shortness of breath and wheezing.   Cardiovascular: Negative for chest pain, palpitations and PND.  Gastrointestinal: Negative for heartburn, nausea, vomiting, abdominal pain, diarrhea and constipation.  Musculoskeletal: Negative for myalgias, joint pain and falls.  Neurological: Negative for dizziness, tingling, tremors, sensory change, speech change, focal weakness, seizures, loss of consciousness, weakness and headaches.  Endo/Heme/Allergies: Negative for polydipsia. Does not bruise/bleed easily.  Psychiatric/Behavioral: Negative for depression, suicidal ideas, hallucinations, memory loss and substance abuse. The patient is not nervous/anxious and does not have insomnia.   Discharge Diagnoses:  AXIS I: Schizoaffective disorder. PTSD. Cannabis use disorder  AXIS II: Deferred  AXIS III: History reviewed. No pertinent past medical history.  AXIS IV: other psychosocial or environmental problems and problems related to social environment  AXIS V: 61-70 mild symptoms   Level of Care:  OP  Hospital Course:  Monica Byrd was admitted to the hospital after she brought her son in to the [facility] for evaluation. She was acting bizarrely, staff became worried and a psychiatric evaluation was done. Child  protective services was called, and her son went home with his grand parents. Monica Byrd was given medical clearance and placed under IVC. This is a first admission for her.  Her labs were unremarkable with the exception of the UDS which was positive for THC.      Upon arrival at the unit Monica Byrd was extremely paranoid, guarded, and disorganized. She noted that she had been smoking THC for years several times a day as a way to cope with the negative memories and anger.   She was started on Hadol and titrated upward to 15mg  a day with some improvement, her mood became more labile and she noted some neck pain after the dose was increased to 15mg , so this was discontinued.  She was continued on cogentin for EPS, and started on Prozac for depression and anxiety.  Due to the continuing psychosis she was started on Risperdal Mtabs at 2mg  to be taken at hs. She had a good response to this.       During her admission Monica Byrd did reveal that she had several stressors in her life including being a full time nursing student, the mother of a 78 year old, and working full time at [employer] as a Lawyer. She also noted that she had had an abortion at some point, and had not received any counseling for this. She was suffering from a great deal of guilt and shame feeling that she had disappointed her family. Monica Byrd also revealed that she had been disciplined severely by her father including being in a group home for over a year at 16 for her oppositional behaviors. 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      Monica Byrd had a good response to the Risperdal and she is  encouraged to continue the medication for at least 3 months. On the day of discharge she was in much improved condition, denied AVH, SI/HI and ready to return home. Her mother was very supportive and understanding.  Monica Byrd agreed to follow up as planned below.   Consults:  None Significant Diagnostic Studies:   None  Discharge Vitals:   Blood pressure 109/76, pulse 96, temperature 98.1 F (36.7 C), temperature source Oral, resp. rate 16, height 5\' 3"  (1.6 m), weight 62.596 kg (138 lb), last menstrual period 07/15/2012. Body mass index is 24.45 kg/(m^2). Lab Results:   No results found for this or any previous visit (from the past 72 hour(s)).  Physical Findings: AIMS: Facial and Oral Movements Muscles of Facial Expression: None, normal Lips and Perioral Area: None, normal Jaw: None, normal Tongue: None, normal,Extremity Movements Upper (arms, wrists, hands, fingers): None, normal Lower (legs, knees, ankles, toes): None, normal, Trunk Movements Neck, shoulders, hips: None, normal, Overall Severity Severity of abnormal movements (highest score from questions above): None, normal Incapacitation due to abnormal movements: None, normal Patient's awareness of abnormal movements (rate only patient's report): No Awareness, Dental Status Current problems with teeth and/or dentures?: No Does patient usually wear dentures?: No  CIWA:  CIWA-Ar Total: 1 COWS:  COWS Total Score: 2  Psychiatric Specialty Exam: See Psychiatric Specialty Exam and Suicide Risk Assessment completed by Attending Physician prior to discharge.  Discharge destination:  Home  Is patient on multiple antipsychotic therapies at discharge:  No   Has Patient had three or more failed trials of antipsychotic monotherapy by history:  No  Recommended Plan for Multiple Antipsychotic Therapies: Not applicable  Discharge Orders   Future Orders Complete By Expires     Diet - low sodium heart healthy  As directed     Discharge instructions  As directed     Comments:      Take all of your medications as directed. Be sure to keep all of your follow up appointments.  If you are unable to keep your follow up appointment, call your Doctor's office to let them know, and reschedule.  Make sure that you have enough medication to last until your  appointment. Be sure to get plenty of rest. Going to bed at the same time each night will help. Try to avoid sleeping during the day.  Increase your activity as tolerated. Regular exercise will help you to sleep better and improve your mental health. Eating a heart healthy diet is recommended. Try to avoid salty or fried foods. Be sure to avoid all alcohol and illegal drugs.    Increase activity slowly  As directed         Medication List    TAKE these medications     Indication   benztropine 0.5 MG tablet  Commonly known as:  COGENTIN  Take 1 tablet (0.5 mg total) by mouth 2 (two) times daily. For prevention of side effects.   Indication:  Extrapyramidal Reaction caused by Medications     FLUoxetine 20 MG capsule  Commonly known as:  PROZAC  Take 1 capsule (20 mg total) by mouth daily. For anxiety and depression   Indication:  Major Depressive Disorder, Post traumatic Stress disorder     risperiDONE 2 MG disintegrating tablet  Commonly known as:  RISPERDAL M-TABS  Take 1 tablet (2 mg total) by mouth 2 (two) times daily. For psychosis.   Indication:  psychosis           Follow-up Information  Follow up with [facility] On 08/12/2012. (Go to the walk-in clinic between 8:30 and 9:30 for hospital after care appointment)    Contact information:   [address]  [place]  [phone]      Follow up with [facility] On 08/11/2012. (10:00 with Aquilla Hacker for counseling)    Contact information:   [address]  [place]  [336] [phone]      Follow-up recommendations:   Activities: Resume activity as tolerated. Diet: Heart healthy low sodium diet Tests: Follow up testing will be determined by your out patient provider Comments:    Total Discharge Time:  Greater than 30 minutes.  Signed: Carlisa Eble 08/06/2012, 8:59 AM

## 2012-08-06 NOTE — Progress Notes (Signed)
Discharge Note  D: Patient's mood was appropriate to the circumstance. 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   A: Support and encouragement provided to patient. Administered scheduled medications per ordering MD. Discharge instructions/prescriptions/medication samples given to patient. Returned belongings to patient.  R: Patient receptive. Denies SI/HI/AVH. Patient d/c without incident to the lobby and transported by mother. Patient verbalized understanding of discharge instructions and prescriptions.

## 2012-08-06 NOTE — Progress Notes (Signed)
Recreation Therapy Notes  Date: 06.04.2014 Time: 9:30am Location: [address] Dayroom      Group Topic/Focus: Communication  Participation Level: Active  Participation Quality: Appropriate, Attentive and Supportive  Affect: Euthymic to Bright  Cognitive: Oriented   Additional Comments: Activity: Community Bulleye; Explanation: Patients were given a sheet titles "60 Ways to Build a Community." Patients were asked to enhance this list as a group. Patients were then given a worksheet with Bullseye. Patients were asked to write their name in the center circle. In the first outlying circle patients were asked to list members of their support system and activities they might participate in with those members of their support system. Patient were asked to select activities off the sheet they were given or the list created by the group. In the outermost circle patients were asked to list community resources and activities from the sheet they were given and the list created by the group.   Patient was in the day room when LRT entered group space. LRT asked patient how she was doing today, patient looked at LRT, smiled brightly and stated she was doing well. Patient shared that she anticipated discharge today and that she is ready to go home. Patient actively participated in group activity. Patient suggested appropriate activities for the group list of ways to build a community. Patient offered support to peer who was having trouble identifying individuals she could use as part of her support system.   Marykay Lex Senai Kingsley, LRT/CTRS  Onnie Hatchel L 08/06/2012 1:30 PM

## 2012-08-06 NOTE — Progress Notes (Signed)
Seen and agreed. Rodolfo Notaro, MD 

## 2012-08-06 NOTE — Progress Notes (Signed)
Adult Psychoeducational Group Note  Date:  08/06/2012 Time:  1:53 AM  Group Topic/Focus:  Wrap-Up Group:   The focus of this group is to help patients review their daily goal of treatment and discuss progress on daily workbooks.  Participation Level:  Active  Participation Quality:  Monopolizing  Affect:  Excited  Cognitive:  Appropriate  Insight: Improving  Engagement in Group:  Engaged  Modes of Intervention:  Support  Additional Comments:  Patient attended and participated in group tonight. She reports having a good day. She advised that she blessed some of her peers today by doing their hair. For her recovery she will continue to take her medication and seek help  Scot Dock 08/06/2012, 1:53 AM

## 2012-08-06 NOTE — Tx Team (Signed)
  Interdisciplinary Treatment Plan Update   Date Reviewed:  08/06/2012  Time Reviewed:  8:35 AM  Progress in Treatment:   Attending groups: Yes Participating in groups: Yes Taking medication as prescribed: Yes  Tolerating medication: Yes Family/Significant other contact made: Yes  Patient understands diagnosis: Yes  Discussing patient identified problems/goals with staff: Yes Medical problems stabilized or resolved: Yes Denies suicidal/homicidal ideation: Yes Patient has not harmed self or others: Yes  For review of initial/current patient goals, please see plan of care.  Estimated Length of Stay:  D/C today  Reason for Continuation of Hospitalization:   New Problems/Goals identified:  N/A  Discharge Plan or Barriers:   return home, follow up outpt  Additional Comments:  Attendees:  Signature: Thedore Mins, MD 08/06/2012 8:35 AM   Signature: Richelle Ito, LCSW 08/06/2012 8:35 AM  Signature: Verne Spurr, PA 08/06/2012 8:35 AM  Signature:  08/06/2012 8:35 AM  Signature: Liborio Nixon, RN 08/06/2012 8:35 AM  Signature:  08/06/2012 8:35 AM  Signature:   08/06/2012 8:35 AM  Signature:    Signature:    Signature:    Signature:    Signature:    Signature:      Scribe for Treatment Team:   Richelle Ito, LCSW  08/06/2012 8:35 AM

## 2012-08-06 NOTE — [facility] Suicide Risk Assessment (Signed)
Suicide Risk Assessment  Discharge Assessment     Demographic Factors:  Low socioeconomic status, Living alone and employed  Mental Status Per Nursing Assessment::   On Admission:  NA  Current Mental Status by Physician: patient denies suicidal ideation, intent or plan  Loss Factors: NA  Historical Factors: Family history of mental illness or substance abuse and Impulsivity  Risk Reduction Factors:   Responsible for children under 25 years of age, Employed, Positive social support and Positive therapeutic relationship  Continued Clinical Symptoms:  Severe Anxiety and/or Agitation Alcohol/Substance Abuse/Dependencies Previous Psychiatric Diagnoses and Treatments  Cognitive Features That Contribute To Risk:  Closed-mindedness Polarized thinking    Suicide Risk:  Minimal: No identifiable suicidal ideation.  Patients presenting with no risk factors but with morbid ruminations; may be classified as minimal risk based on the severity of the depressive symptoms  Discharge Diagnoses: AXIS I:  Schizoaffective disorder. PTSD. Cannabis use disorder AXIS II:  Deferred AXIS III:  History reviewed. No pertinent past medical history. AXIS IV:  other psychosocial or environmental problems and problems related to social environment AXIS V:  61-70 mild symptoms  Plan Of Care/Follow-up recommendations:  Activity:  as tolerated  Diet:  heathy Tests:  routine  Other:  patient to keep her after care appointment  Is patient on multiple antipsychotic therapies at discharge:  No   Has Patient had three or more failed trials of antipsychotic monotherapy by history:  No  Recommended Plan for Multiple Antipsychotic Therapies: N/A  Mujtaba Bollig,MD 08/06/2012, 8:58 AM

## 2012-08-07 NOTE — Progress Notes (Signed)
Patient Discharge Instructions:  After Visit Summary (AVS):   Faxed to:  08/07/12 Psychiatric Admission Assessment Note:   Faxed to:  08/07/12 Suicide Risk Assessment - Discharge Assessment:   Faxed to:  08/07/12 Faxed/Sent to the Next Level Care provider:  08/07/12 Faxed to [facility] @ [phone] Faxed to [facility] @ [phone]  Jerelene Redden, 08/07/2012, 3:57 PM

## 2012-08-12 NOTE — Discharge Summary (Signed)
Seen and agreed. Taiwana Willison, MD 

## 2012-08-18 MED ORDER — RISPERIDONE 2 MG PO TABS
2.0000 mg | ORAL_TABLET | Freq: Two times a day (BID) | ORAL | Status: DC
  Filled 2012-08-18: qty 28

## 2013-10-31 ENCOUNTER — Emergency Department (HOSPITAL_COMMUNITY)
Admission: EM | Admit: 2013-10-31 | Discharge: 2013-11-01 | Disposition: A | Discharge status: Home / Self Care | Payer: Self-pay | Attending: Emergency Medicine | Admitting: Emergency Medicine

## 2013-10-31 ENCOUNTER — Encounter (HOSPITAL_COMMUNITY): Payer: Self-pay | Admitting: Emergency Medicine

## 2013-10-31 DIAGNOSIS — Z79899 Other long term (current) drug therapy: Secondary | ICD-10-CM | POA: Insufficient documentation

## 2013-10-31 DIAGNOSIS — N898 Other specified noninflammatory disorders of vagina: Secondary | ICD-10-CM | POA: Insufficient documentation

## 2013-10-31 DIAGNOSIS — Z3202 Encounter for pregnancy test, result negative: Secondary | ICD-10-CM | POA: Insufficient documentation

## 2013-10-31 DIAGNOSIS — N72 Inflammatory disease of cervix uteri: Secondary | ICD-10-CM | POA: Insufficient documentation

## 2013-10-31 LAB — WET PREP, GENITAL
CLUE CELLS WET PREP: NONE SEEN
Trich, Wet Prep: NONE SEEN
Yeast Wet Prep HPF POC: NONE SEEN

## 2013-10-31 LAB — URINALYSIS, ROUTINE W REFLEX MICROSCOPIC
BILIRUBIN URINE: NEGATIVE
Glucose, UA: NEGATIVE mg/dL
HGB URINE DIPSTICK: NEGATIVE
Ketones, ur: NEGATIVE mg/dL
Leukocytes, UA: NEGATIVE
NITRITE: NEGATIVE
PH: 5.5 (ref 5.0–8.0)
Protein, ur: NEGATIVE mg/dL
SPECIFIC GRAVITY, URINE: 1.024 (ref 1.005–1.030)
Urobilinogen, UA: 0.2 mg/dL (ref 0.0–1.0)

## 2013-10-31 LAB — PREGNANCY, URINE: Preg Test, Ur: NEGATIVE

## 2013-10-31 MED ORDER — CEFTRIAXONE SODIUM 250 MG IJ SOLR
250.0000 mg | Freq: Once | INTRAMUSCULAR | Status: AC
  Administered 2013-11-01: 250 mg via INTRAMUSCULAR
  Filled 2013-10-31 (×2): qty 250

## 2013-10-31 MED ORDER — AZITHROMYCIN 1 G PO PACK
1.0000 g | PACK | Freq: Once | ORAL | Status: AC
  Administered 2013-10-31: 1 g via ORAL
  Filled 2013-10-31: qty 1

## 2013-10-31 MED ORDER — STERILE WATER FOR INJECTION IJ SOLN
INTRAMUSCULAR | Status: AC
  Administered 2013-10-31: 0.9 mL
  Filled 2013-10-31: qty 10

## 2013-10-31 NOTE — ED Provider Notes (Signed)
CSN: [id number]     Arrival date & time 10/31/13  2230 History   First MD Initiated Contact with Patient 10/31/13 2255     Chief Complaint  Patient presents with  . Vaginal Discharge  . Headache     (Consider location/radiation/quality/duration/timing/severity/associated sxs/prior Treatment) HPI  Monica Byrd is a 28 y.o. female with no significant past medical history coming in with 6 weeks of vaginal discharge. Patient states has been increasing in abnormal. Some abdominal cramping her left lower quadrant. Nothing makes it better or worse. She also had dysuria during the interval. Patient states he had unprotected sex 6 weeks ago, and had an irregular period 2 weeks ago that was off of her normal cycle. Patient also had vaginal bleeding that has begun to resolve, yesterday she states she passed dark red clots. Review systems is positive for dysuria and diarrhea as well. She denies any fevers chills chest pain or shortness of breath.   10 Systems reviewed and are negative for acute change except as noted in the HPI.    History reviewed. No pertinent past medical history. History reviewed. No pertinent past surgical history. No family history on file. History  Substance Use Topics  . Smoking status: Never Smoker   . Smokeless tobacco: Not on file  . Alcohol Use: No   OB History   Grav Para Term Preterm Abortions TAB SAB Ect Mult Living                 Review of Systems    Allergies  Review of patient's allergies indicates no known allergies.  Home Medications   Prior to Admission medications   Medication Sig Start Date End Date Taking? Authorizing Provider  benztropine (COGENTIN) 0.5 MG tablet Take 1 tablet (0.5 mg total) by mouth 2 (two) times daily. For prevention of side effects. 08/06/12   Verne Spurr, PA-C  FLUoxetine (PROZAC) 20 MG capsule Take 1 capsule (20 mg total) by mouth daily. For anxiety and depression 08/06/12   Verne Spurr, PA-C  risperiDONE  (RISPERDAL M-TABS) 2 MG disintegrating tablet Take 1 tablet (2 mg total) by mouth 2 (two) times daily. For psychosis. 08/06/12   Verne Spurr, PA-C   BP 121/70  Pulse 79  Temp(Src) 98.4 F (36.9 C) (Oral)  Resp 14  Ht  (1.626 m)  Wt 158 lb (71.668 kg)  BMI 27.11 kg/m2  SpO2 98%  LMP 10/25/2013 Physical Exam  Nursing note and vitals reviewed. Constitutional: She is oriented to person, place, and time. She appears well-developed and well-nourished. No distress.  HENT:  Head: Normocephalic and atraumatic.  Nose: Nose normal.  Mouth/Throat: Oropharynx is clear and moist. No oropharyngeal exudate.  Eyes: Conjunctivae and EOM are normal. Pupils are equal, round, and reactive to light. No scleral icterus.  Neck: Normal range of motion. Neck supple. No JVD present. No tracheal deviation present. No thyromegaly present.  Cardiovascular: Normal rate, regular rhythm and normal heart sounds.  Exam reveals no gallop and no friction rub.   No murmur heard. Pulmonary/Chest: Effort normal and breath sounds normal. No respiratory distress. She has no wheezes. She exhibits no tenderness.  Abdominal: Soft. Bowel sounds are normal. She exhibits no distension and no mass. There is no tenderness. There is no rebound and no guarding.  Genitourinary: Uterus normal. Vaginal discharge found.  L adnexal tenderness, no CMT  Musculoskeletal: Normal range of motion. She exhibits no edema and no tenderness.  Lymphadenopathy:    She has no cervical adenopathy.  Neurological: She is alert and oriented to person, place, and time.  Skin: Skin is warm and dry. No rash noted. She is not diaphoretic. No erythema. No pallor.    ED Course  Procedures (including critical care time) Labs Review Labs Reviewed  WET PREP, GENITAL - Abnormal; Notable for the following:    WBC, Wet Prep HPF POC RARE (*)    All other components within normal limits  GC/CHLAMYDIA PROBE AMP  URINALYSIS, ROUTINE W REFLEX MICROSCOPIC   PREGNANCY, URINE  HIV ANTIBODY (ROUTINE TESTING)    Imaging Review US Transvaginal Non-ob  11/01/2013   CLINICAL DATA:  Left adnexal pain. Pain all over. Fatigue, muscle aches, dizziness, shortness of breath, headaches, nausea, diarrhea, increased irregular heart rate, weight loss, migraines.  EXAM: TRANSABDOMINAL AND TRANSVAGINAL ULTRASOUND OF PELVIS  TECHNIQUE: Both transabdominal and transvaginal ultrasound examinations of the pelvis were performed. Transabdominal technique was performed for global imaging of the pelvis including uterus, ovaries, adnexal regions, and pelvic cul-de-sac. It was necessary to proceed with endovaginal exam following the transabdominal exam to visualize the endometrium and ovaries.  COMPARISON:  None.  FINDINGS: Uterus  Measurements: 8.1 x 4.8 x 5.1 cm, anteverted. No fibroids or other mass visualized.  Endometrium  Thickness: 6 mm.  No focal abnormality visualized.  Right ovary  Measurements: 3.8 x 2.7 x 3.6 cm. Normal appearance/no adnexal mass.  Left ovary  Measurements: 4.3 x 1.7 x 2.6 cm. Normal appearance/no adnexal mass.  Other findings  Minimal free fluid in the pelvis. 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  IMPRESSION: Normal sound appearance of the uterus and ovaries. Gonadal vein varices. Minimal free fluid.   Electronically Signed   By: Burman Nieves M.D.   On: 11/01/2013 02:28   US Pelvis Complete  11/01/2013   CLINICAL DATA:  Left adnexal pain. Pain all over. Fatigue, muscle aches, dizziness, shortness of breath, headaches, nausea, diarrhea, increased irregular heart rate, weight loss, migraines.  EXAM: TRANSABDOMINAL AND TRANSVAGINAL ULTRASOUND OF PELVIS  TECHNIQUE: Both transabdominal and transvaginal ultrasound examinations of the pelvis were performed. Transabdominal technique was performed for global imaging of the pelvis including uterus, ovaries, adnexal regions, and pelvic cul-de-sac. It was  necessary to proceed with endovaginal exam following the transabdominal exam to visualize the endometrium and ovaries.  COMPARISON:  None.  FINDINGS: Uterus  Measurements: 8.1 x 4.8 x 5.1 cm, anteverted. No fibroids or other mass visualized.  Endometrium  Thickness: 6 mm.  No focal abnormality visualized.  Right ovary  Measurements: 3.8 x 2.7 x 3.6 cm. Normal appearance/no adnexal mass.  Left ovary  Measurements: 4.3 x 1.7 x 2.6 cm. Normal appearance/no adnexal mass.  Other findings  Minimal free fluid in the pelvis. 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  IMPRESSION: Normal sound appearance of the uterus and ovaries. Gonadal vein varices. Minimal free fluid.   Electronically Signed   By: Burman Nieves M.D.   On: 11/01/2013 02:28     EKG Interpretation None      MDM   Final diagnoses:  None    Patient presents today with symptoms of abdominal cramping and abnormal vaginal discharge for the past several weeks. It is likely the patient miscarried after episode of unprotected sex 6 weeks ago. Will obtain urine studies, GC Chlamydia and transvaginal ultrasound to 2 adnexal tenderness on exam. Patient was given ceftriaxone azithromycin emergency department.  Advised to follow up with PCP within 3 days.  Verbal STI  DC instructions were given.    Tomasita Crumble, MD 11/01/13 [phone]

## 2013-10-31 NOTE — ED Notes (Signed)
Pt. reports "yellowish" vaginal discharge onset last week and intermittent  headache /ligheadedness for 2 weeks . Denies dysuria , fever or chills.

## 2013-11-01 ENCOUNTER — Emergency Department (HOSPITAL_COMMUNITY): Payer: MEDICAID

## 2013-11-01 LAB — HIV ANTIBODY (ROUTINE TESTING W REFLEX): HIV: NONREACTIVE

## 2013-11-01 NOTE — Discharge Instructions (Signed)
Cervicitis Ms. Vanhouten, you were seen today for vaginal discharge and abnormal bleeding.  You were treated for infections and will be called if they come back positive.  Your ultrasound results are below.  Follow up with a OB doctor within 3 days to discuss your results and for continued care.  Return to the ED for any worsening.  Thank you.   IMPRESSION: Normal sound appearance of the uterus and ovaries. Gonadal vein varices. Minimal free fluid.   Cervicitis is a soreness and puffiness (inflammation) of the cervix.  HOME CARE  Do not have sex (intercourse) until your doctor says it is okay.  Do not have sex until your partner is treated or as told by your doctor.  Take your antibiotic medicine as told. Finish it even if you start to feel better. GET HELP IF:   Your symptoms that brought you to the doctor come back.  You have a fever. MAKE SURE YOU:   Understand these instructions.  Will watch your condition.  Will get help right away if you are not doing well or get worse. Document Released: 11/29/2007 Document Revised: 02/24/2013 Document Reviewed: 08/13/2012 [facility] Patient Information 2015 [place], [place]. This information is not intended to replace advice given to you by your health care provider. Make sure you discuss any questions you have with your health care provider.

## 2013-11-01 NOTE — ED Notes (Signed)
Patient transported to Ultrasound 

## 2013-11-03 LAB — GC/CHLAMYDIA PROBE AMP
CT Probe RNA: NEGATIVE
GC PROBE AMP APTIMA: NEGATIVE

## 2013-11-19 ENCOUNTER — Encounter: Payer: Self-pay | Admitting: Obstetrics & Gynecology

## 2014-02-13 ENCOUNTER — Encounter (HOSPITAL_COMMUNITY): Payer: Self-pay | Admitting: Emergency Medicine

## 2014-02-13 DIAGNOSIS — R112 Nausea with vomiting, unspecified: Secondary | ICD-10-CM | POA: Insufficient documentation

## 2014-02-13 DIAGNOSIS — Z3202 Encounter for pregnancy test, result negative: Secondary | ICD-10-CM | POA: Insufficient documentation

## 2014-02-13 DIAGNOSIS — Z79899 Other long term (current) drug therapy: Secondary | ICD-10-CM | POA: Insufficient documentation

## 2014-02-13 DIAGNOSIS — N898 Other specified noninflammatory disorders of vagina: Secondary | ICD-10-CM | POA: Insufficient documentation

## 2014-02-13 LAB — COMPREHENSIVE METABOLIC PANEL
ALBUMIN: 4.4 g/dL (ref 3.5–5.2)
ALK PHOS: 64 U/L (ref 39–117)
ALT: 17 U/L (ref 0–35)
ANION GAP: 14 (ref 5–15)
AST: 24 U/L (ref 0–37)
BUN: 28 mg/dL — AB (ref 6–23)
CO2: 23 mEq/L (ref 19–32)
Calcium: 9.7 mg/dL (ref 8.4–10.5)
Chloride: 101 mEq/L (ref 96–112)
Creatinine, Ser: 0.91 mg/dL (ref 0.50–1.10)
GFR calc Af Amer: >90 mL/min (ref >90)
GFR calc non Af Amer: 86 mL/min — ABNORMAL LOW (ref >90)
Glucose, Bld: 82 mg/dL (ref 70–99)
POTASSIUM: 4.1 meq/L (ref 3.7–5.3)
Sodium: 138 mEq/L (ref 137–147)
TOTAL PROTEIN: 7.8 g/dL (ref 6.0–8.3)
Total Bilirubin: 0.3 mg/dL (ref 0.3–1.2)

## 2014-02-13 LAB — URINALYSIS, ROUTINE W REFLEX MICROSCOPIC
Bilirubin Urine: NEGATIVE
Glucose, UA: NEGATIVE mg/dL
Hgb urine dipstick: NEGATIVE
Ketones, ur: NEGATIVE mg/dL
LEUKOCYTES UA: NEGATIVE
NITRITE: NEGATIVE
PH: 6 (ref 5.0–8.0)
Protein, ur: NEGATIVE mg/dL
SPECIFIC GRAVITY, URINE: 1.03 (ref 1.005–1.030)
Urobilinogen, UA: 0.2 mg/dL (ref 0.0–1.0)

## 2014-02-13 LAB — CBC WITH DIFFERENTIAL/PLATELET
BASOS ABS: 0 10*3/uL (ref 0.0–0.1)
BASOS PCT: 0 % (ref 0–1)
EOS ABS: 0.1 10*3/uL (ref 0.0–0.7)
Eosinophils Relative: 1 % (ref 0–5)
HCT: 38.5 % (ref 36.0–46.0)
Hemoglobin: 12.8 g/dL (ref 12.0–15.0)
Lymphocytes Relative: 31 % (ref 12–46)
Lymphs Abs: 3.1 10*3/uL (ref 0.7–4.0)
MCH: 30.7 pg (ref 26.0–34.0)
MCHC: 33.2 g/dL (ref 30.0–36.0)
MCV: 92.3 fL (ref 78.0–100.0)
Monocytes Absolute: 0.8 10*3/uL (ref 0.1–1.0)
Monocytes Relative: 8 % (ref 3–12)
Neutro Abs: 5.9 10*3/uL (ref 1.7–7.7)
Neutrophils Relative %: 60 % (ref 43–77)
PLATELETS: 254 10*3/uL (ref 150–400)
RBC: 4.17 MIL/uL (ref 3.87–5.11)
RDW: 13.1 % (ref 11.5–15.5)
WBC: 9.8 10*3/uL (ref 4.0–10.5)

## 2014-02-13 LAB — PREGNANCY, URINE: Preg Test, Ur: NEGATIVE

## 2014-02-13 NOTE — ED Notes (Signed)
Vaginal discharge for 3 days.  Nausea and vomiting with headache today.

## 2014-02-14 ENCOUNTER — Emergency Department (HOSPITAL_COMMUNITY)
Admission: EM | Admit: 2014-02-14 | Discharge: 2014-02-14 | Disposition: A | Discharge status: Home / Self Care | Payer: MEDICAID | Attending: Emergency Medicine | Admitting: Emergency Medicine

## 2014-02-14 DIAGNOSIS — N898 Other specified noninflammatory disorders of vagina: Secondary | ICD-10-CM

## 2014-02-14 LAB — WET PREP, GENITAL
Trich, Wet Prep: NONE SEEN
YEAST WET PREP: NONE SEEN

## 2014-02-14 LAB — RPR

## 2014-02-14 LAB — HIV ANTIBODY (ROUTINE TESTING W REFLEX): HIV 1&2 Ab, 4th Generation: NONREACTIVE

## 2014-02-14 MED ORDER — SODIUM CHLORIDE 0.9 % IV SOLN: 1000.0000 mL | Freq: Once | INTRAVENOUS | Status: DC

## 2014-02-14 MED ORDER — SODIUM CHLORIDE 0.9 % IV SOLN
1000.0000 mL | INTRAVENOUS | Status: DC
Start: 2014-02-14 — End: 2014-02-14

## 2014-02-14 NOTE — ED Provider Notes (Addendum)
CSN: [id number]     Arrival date & time 02/13/14  1955 History  This chart was scribed for Dione Boozeavid Tyrease Vandeberg, MD by Freida Busmaniana Omoyeni, ED Scribe. This patient was seen in room B16C/B16C and the patient's care was started 12:32 AM.    Chief Complaint  Patient presents with  . Vaginal Discharge     The history is provided by the patient. No language interpreter was used.     HPI Comments:  Monica Byrd is a 28 y.o. female who presents to the Emergency Department complaining of lightheadedness for 1 day. She reports associated nausea which has improved, 2 episodes of vomiting and HA with pain to the top of her head that she woke up with today. She notes a recent increase in stress, decreased PO intake and states her HA has improved at this time. She also complains of vaginal discharge which she describes as milky and odorless that she noticed 2 days ago. She notes a brownish colored discharge in panty liner today. LNMP about 2 weeks ago. Pt is sexually active and uses condoms regularly but admits she sometimes drinks ETOH beforehand and cannot always remember if she used a condom. She notes mild abdominal cramping that started today. No alleviating factors noted.   History reviewed. No pertinent past medical history. History reviewed. No pertinent past surgical history. No family history on file. History  Substance Use Topics  . Smoking status: Never Smoker   . Smokeless tobacco: Not on file  . Alcohol Use: No   OB History    No data available     Review of Systems  Gastrointestinal: Positive for nausea, vomiting and abdominal pain.  Genitourinary: Positive for vaginal discharge.  Neurological: Positive for light-headedness and headaches.  All other systems reviewed and are negative.     Allergies  Review of patient's allergies indicates no known allergies.  Home Medications   Prior to Admission medications   Medication Sig Start Date End Date Taking? Authorizing Provider   acetaminophen (TYLENOL) 500 MG tablet Take 500 mg by mouth every 6 (six) hours as needed for moderate pain.    Historical Provider, MD  Multiple Vitamin (MULTIVITAMIN WITH MINERALS) TABS tablet Take 1 tablet by mouth daily.    Historical Provider, MD   BP 134/81 mmHg  Pulse 82  Temp(Src) 97.9 F (36.6 C) (Oral)  Ht 5\' 4"  (1.626 m)  Wt 158 lb (71.668 kg)  BMI 27.11 kg/m2  SpO2 100%  LMP 02/01/2014 (Approximate) Physical Exam  Constitutional: She is oriented to person, place, and time. She appears well-developed and well-nourished.  HENT:  Head: Normocephalic and atraumatic.  Eyes: Conjunctivae are normal. Pupils are equal, round, and reactive to light.  Neck: Normal range of motion. Neck supple. No JVD present.  Cardiovascular: Normal rate and regular rhythm.   No murmur heard. Wide split S2  Pulmonary/Chest: Effort normal and breath sounds normal. She has no wheezes. She has no rales.  Abdominal: Soft. Bowel sounds are normal. She exhibits no distension and no mass. There is no tenderness.  Genitourinary:  Normal external female genitalia. Cervix has 2 nodules on the posterior lip which are firm but not hard. Small amount of pale white discharge present. No bleeding or obvious cervical discharge. Fundus is normal size and position. No adnexal masses or tenderness.  Musculoskeletal: Normal range of motion. She exhibits no edema.  Lymphadenopathy:    She has no cervical adenopathy.  Neurological: She is alert and oriented to person, place, and time.  Skin: Skin is warm and dry. No rash noted.  Psychiatric: She has a normal mood and affect. Her behavior is normal. Thought content normal.  Nursing note and vitals reviewed.   ED Course  Procedures   DIAGNOSTIC STUDIES:  Oxygen Saturation is 100% on RA, normal by my interpretation.    COORDINATION OF CARE:  12:41 AM Discussed treatment plan with pt at bedside and pt agreed to plan.  Labs Review Results for orders placed or  performed during the hospital encounter of 02/14/14  Wet prep, genital  Result Value Ref Range   Yeast Wet Prep HPF POC NONE SEEN NONE SEEN   Trich, Wet Prep NONE SEEN NONE SEEN   Clue Cells Wet Prep HPF POC FEW (A) NONE SEEN   WBC, Wet Prep HPF POC MANY (A) NONE SEEN  CBC with Differential  Result Value Ref Range   WBC 9.8 4.0 - 10.5 K/uL   RBC 4.17 3.87 - 5.11 MIL/uL   Hemoglobin 12.8 12.0 - 15.0 g/dL   HCT 16.138.5 09.636.0 - 04.546.0 %   MCV 92.3 78.0 - 100.0 fL   MCH 30.7 26.0 - 34.0 pg   MCHC 33.2 30.0 - 36.0 g/dL   RDW 40.913.1 81.111.5 - 91.415.5 %   Platelets 254 150 - 400 K/uL   Neutrophils Relative % 60 43 - 77 %   Neutro Abs 5.9 1.7 - 7.7 K/uL   Lymphocytes Relative 31 12 - 46 %   Lymphs Abs 3.1 0.7 - 4.0 K/uL   Monocytes Relative 8 3 - 12 %   Monocytes Absolute 0.8 0.1 - 1.0 K/uL   Eosinophils Relative 1 0 - 5 %   Eosinophils Absolute 0.1 0.0 - 0.7 K/uL   Basophils Relative 0 0 - 1 %   Basophils Absolute 0.0 0.0 - 0.1 K/uL  Comprehensive metabolic panel  Result Value Ref Range   Sodium 138 137 - 147 mEq/L   Potassium 4.1 3.7 - 5.3 mEq/L   Chloride 101 96 - 112 mEq/L   CO2 23 19 - 32 mEq/L   Glucose, Bld 82 70 - 99 mg/dL   BUN 28 (H) 6 - 23 mg/dL   Creatinine, Ser 7.820.91 0.50 - 1.10 mg/dL   Calcium 9.7 8.4 - 95.610.5 mg/dL   Total Protein 7.8 6.0 - 8.3 g/dL   Albumin 4.4 3.5 - 5.2 g/dL   AST 24 0 - 37 U/L   ALT 17 0 - 35 U/L   Alkaline Phosphatase 64 39 - 117 U/L   Total Bilirubin 0.3 0.3 - 1.2 mg/dL   GFR calc non Af Amer 86 (L) >90 mL/min   GFR calc Af Amer >90 >90 mL/min   Anion gap 14 5 - 15  Urinalysis, Routine w reflex microscopic  Result Value Ref Range   Color, Urine YELLOW YELLOW   APPearance CLEAR CLEAR   Specific Gravity, Urine 1.030 1.005 - 1.030   pH 6.0 5.0 - 8.0   Glucose, UA NEGATIVE NEGATIVE mg/dL   Hgb urine dipstick NEGATIVE NEGATIVE   Bilirubin Urine NEGATIVE NEGATIVE   Ketones, ur NEGATIVE NEGATIVE mg/dL   Protein, ur NEGATIVE NEGATIVE mg/dL    Urobilinogen, UA 0.2 0.0 - 1.0 mg/dL   Nitrite NEGATIVE NEGATIVE   Leukocytes, UA NEGATIVE NEGATIVE  Pregnancy, urine  Result Value Ref Range   Preg Test, Ur NEGATIVE NEGATIVE   MDM   Final diagnoses:  Vaginal discharge    Vaginal discharge of uncertain cause. Part prep is pending. Dizziness is  likely due to dehydration given elevated BUN relative to creatinine. Patient was offered IV hydration but has refused. Pelvic exam shows nodules in the cervix. She will be referred back to her gynecologist for further evaluation.  Wet prep is positive for few clue cells. I did not feel that this is clinically significant and do not feel it needs treatment. She states that she will make an appointment with her gynecologist for the next week.  I personally performed the services described in this documentation, which was scribed in my presence. The recorded information has been reviewed and is accurate.       Dione Booze, MD 02/14/14 8119  Dione Booze, MD 02/14/14 [phone]

## 2014-02-14 NOTE — Discharge Instructions (Signed)
Your swab did not show a clear cause for your discharge. However, there were 2 nodules on your cervix which should be evaluated by her gynecologist. Please call on Monday morning for an appointment this coming week. You are also due for a Pap smear, which can be done in his office.

## 2014-02-15 LAB — GC/CHLAMYDIA PROBE AMP
CT PROBE, AMP APTIMA: NEGATIVE
GC Probe RNA: NEGATIVE

## 2015-02-09 ENCOUNTER — Emergency Department (HOSPITAL_COMMUNITY)
Admission: EM | Admit: 2015-02-09 | Discharge: 2015-02-10 | Disposition: A | Discharge status: Home / Self Care | Payer: Self-pay | Attending: Emergency Medicine | Admitting: Emergency Medicine

## 2015-02-09 ENCOUNTER — Encounter (HOSPITAL_COMMUNITY): Payer: Self-pay | Admitting: Emergency Medicine

## 2015-02-09 DIAGNOSIS — N760 Acute vaginitis: Secondary | ICD-10-CM | POA: Insufficient documentation

## 2015-02-09 DIAGNOSIS — Z3202 Encounter for pregnancy test, result negative: Secondary | ICD-10-CM | POA: Insufficient documentation

## 2015-02-09 DIAGNOSIS — B9689 Other specified bacterial agents as the cause of diseases classified elsewhere: Secondary | ICD-10-CM

## 2015-02-09 DIAGNOSIS — K529 Noninfective gastroenteritis and colitis, unspecified: Secondary | ICD-10-CM | POA: Insufficient documentation

## 2015-02-09 DIAGNOSIS — Z79899 Other long term (current) drug therapy: Secondary | ICD-10-CM | POA: Insufficient documentation

## 2015-02-09 LAB — URINALYSIS, ROUTINE W REFLEX MICROSCOPIC
Glucose, UA: NEGATIVE mg/dL
Hgb urine dipstick: NEGATIVE
Ketones, ur: NEGATIVE mg/dL
Leukocytes, UA: NEGATIVE
NITRITE: NEGATIVE
Protein, ur: NEGATIVE mg/dL
SPECIFIC GRAVITY, URINE: 1.031 — AB (ref 1.005–1.030)
pH: 5.5 (ref 5.0–8.0)

## 2015-02-09 LAB — COMPREHENSIVE METABOLIC PANEL
ALBUMIN: 4 g/dL (ref 3.5–5.0)
ALT: 18 U/L (ref 14–54)
AST: 23 U/L (ref 15–41)
Alkaline Phosphatase: 53 U/L (ref 38–126)
Anion gap: 7 (ref 5–15)
BILIRUBIN TOTAL: 0.8 mg/dL (ref 0.3–1.2)
BUN: 8 mg/dL (ref 6–20)
CALCIUM: 9.2 mg/dL (ref 8.9–10.3)
CHLORIDE: 105 mmol/L (ref 101–111)
CO2: 26 mmol/L (ref 22–32)
Creatinine, Ser: 1 mg/dL (ref 0.44–1.00)
GFR calc Af Amer: >60 mL/min (ref >60)
GFR calc non Af Amer: >60 mL/min (ref >60)
Glucose, Bld: 117 mg/dL — ABNORMAL HIGH (ref 65–99)
POTASSIUM: 3 mmol/L — AB (ref 3.5–5.1)
Sodium: 138 mmol/L (ref 135–145)
TOTAL PROTEIN: 7.2 g/dL (ref 6.5–8.1)

## 2015-02-09 LAB — WET PREP, GENITAL
Sperm: NONE SEEN
Trich, Wet Prep: NONE SEEN
YEAST WET PREP: NONE SEEN

## 2015-02-09 LAB — CBC
HCT: 39.5 % (ref 36.0–46.0)
Hemoglobin: 13 g/dL (ref 12.0–15.0)
MCH: 30.8 pg (ref 26.0–34.0)
MCHC: 32.9 g/dL (ref 30.0–36.0)
MCV: 93.6 fL (ref 78.0–100.0)
Platelets: 242 10*3/uL (ref 150–400)
RBC: 4.22 MIL/uL (ref 3.87–5.11)
RDW: 13.6 % (ref 11.5–15.5)
WBC: 10 10*3/uL (ref 4.0–10.5)

## 2015-02-09 LAB — POC URINE PREG, ED: PREG TEST UR: NEGATIVE

## 2015-02-09 LAB — LIPASE, BLOOD: LIPASE: 31 U/L (ref 11–51)

## 2015-02-09 MED ORDER — POTASSIUM CHLORIDE 10 MEQ/100ML IV SOLN
10.0000 meq | Freq: Once | INTRAVENOUS | Status: AC
  Administered 2015-02-09: 10 meq via INTRAVENOUS
  Filled 2015-02-09: qty 100

## 2015-02-09 MED ORDER — POTASSIUM CHLORIDE CRYS ER 20 MEQ PO TBCR
40.0000 meq | EXTENDED_RELEASE_TABLET | Freq: Once | ORAL | Status: AC
  Administered 2015-02-09: 40 meq via ORAL
  Filled 2015-02-09: qty 2

## 2015-02-09 MED ORDER — CEFTRIAXONE SODIUM 250 MG IJ SOLR
250.0000 mg | Freq: Once | INTRAMUSCULAR | Status: AC
  Administered 2015-02-09: 250 mg via INTRAMUSCULAR
  Filled 2015-02-09: qty 250

## 2015-02-09 MED ORDER — SODIUM CHLORIDE 0.9 % IV BOLUS (SEPSIS)
1000.0000 mL | Freq: Once | INTRAVENOUS | Status: AC
  Administered 2015-02-09: 1000 mL via INTRAVENOUS

## 2015-02-09 MED ORDER — ACETAMINOPHEN 325 MG PO TABS
650.0000 mg | ORAL_TABLET | Freq: Once | ORAL | Status: AC
  Administered 2015-02-09: 650 mg via ORAL
  Filled 2015-02-09: qty 2

## 2015-02-09 MED ORDER — STERILE WATER FOR INJECTION IJ SOLN
INTRAMUSCULAR | Status: AC
  Administered 2015-02-09: 10 mL
  Filled 2015-02-09: qty 10

## 2015-02-09 MED ORDER — PROMETHAZINE HCL 12.5 MG PO TABS
12.5000 mg | ORAL_TABLET | Freq: Once | ORAL | Status: AC
  Administered 2015-02-09: 12.5 mg via ORAL
  Filled 2015-02-09: qty 1

## 2015-02-09 MED ORDER — METRONIDAZOLE 500 MG PO TABS
500.0000 mg | ORAL_TABLET | Freq: Two times a day (BID) | ORAL | Status: DC
Start: 2015-02-09 — End: 2017-10-21

## 2015-02-09 MED ORDER — ONDANSETRON HCL 4 MG PO TABS: 4.0000 mg | ORAL_TABLET | Freq: Four times a day (QID) | ORAL | Status: DC

## 2015-02-09 MED ORDER — AZITHROMYCIN 250 MG PO TABS
1000.0000 mg | ORAL_TABLET | Freq: Once | ORAL | Status: AC
  Administered 2015-02-09: 1000 mg via ORAL
  Filled 2015-02-09: qty 4

## 2015-02-09 NOTE — ED Provider Notes (Signed)
CSN: [id number]   Arrival date & time 02/09/15 1916  History  By signing my name below, I, Bethel Born, attest that this documentation has been prepared under the direction and in the presence of Rolm Gala Jetaime Pinnix PA-C Electronically Signed: Bethel Born, ED Scribe. 02/09/2015. 8:58 PM. Chief Complaint  Patient presents with  . Abdominal Pain    HPI The history is provided by the patient. No language interpreter was used.   Monica Byrd is a 29 y.o. female who presents to the Emergency Department complaining of constant, cramping, 8/10 in severity, lower abdominal pain with onset this morning at approximately 2 AM. Associated symptoms include nausea, emesis, diarrhea, headache, and fatigue. She states that her headache is a generalized throbbing and that it is mild. She has been using Pepto Bismol at home with insufficient relief in symptoms. She has not tried any OTC pain relievers. Pt denies fever, chills, headache, dizziness, syncope, chest pain, SOB, dysuria, flank pain, hematuria, vaginal discharge or myalgias. She is also requesting STD testing due to concern for STD exposure after an unprotectd encounter. 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   History reviewed. No pertinent past medical history.  History reviewed. No pertinent past surgical history.  No family history on file.  Social History  Substance Use Topics  . Smoking status: Never Smoker   . Smokeless tobacco: None  . Alcohol Use: No     Review of Systems  Constitutional: Positive for fatigue. Negative for fever and chills.  Gastrointestinal: Positive for nausea, vomiting, abdominal pain and diarrhea.  Genitourinary: Negative for dysuria and vaginal discharge.  Neurological: Positive for headaches.  All other systems reviewed and are negative.  Home Medications   Prior to Admission medications   Medication Sig Start Date End Date Taking? Authorizing  Provider  acetaminophen (TYLENOL) 500 MG tablet Take 500 mg by mouth every 6 (six) hours as needed for moderate pain.    Historical Provider, MD  metroNIDAZOLE (FLAGYL) 500 MG tablet Take 1 tablet (500 mg total) by mouth 2 (two) times daily. 02/09/15   [place], PA-C  Multiple Vitamin (MULTIVITAMIN WITH MINERALS) TABS tablet Take 1 tablet by mouth daily.    Historical Provider, MD  ondansetron (ZOFRAN) 4 MG tablet Take 1 tablet (4 mg total) by mouth every 6 (six) hours. 02/09/15   Onofre Gains, PA-C    Allergies  Review of patient's allergies indicates no known allergies.  Triage Vitals: BP 134/80 mmHg  Pulse 119  Temp(Src) 98.8 F (37.1 C) (Oral)  Resp 16  Ht  (1.626 m)  Wt 191 lb (86.637 kg)  BMI 32.77 kg/m2  SpO2 99%  LMP 01/11/2015 (Approximate)  Physical Exam  Constitutional: She appears well-developed and well-nourished. No distress.  Nontoxic appearing  HENT:  Head: Normocephalic and atraumatic.  Right Ear: External ear normal.  Left Ear: External ear normal.  Eyes: Conjunctivae are normal. Right eye exhibits no discharge. Left eye exhibits no discharge. No scleral icterus.  Neck: Normal range of motion.  Cardiovascular: Normal rate and normal heart sounds.   Pulmonary/Chest: Effort normal and breath sounds normal. No respiratory distress. She has no wheezes. She has no rales.  Abdominal: Soft. Bowel sounds are normal. She exhibits no distension. There is no tenderness. There is no rebound and no guarding.  Genitourinary: Uterus normal. There is no rash on the right labia. There is no rash on the left labia. Cervix exhibits discharge and friability.  Cervix exhibits no motion tenderness. Right adnexum displays no mass and no tenderness. Left adnexum displays no mass and no tenderness. No erythema or bleeding in the vagina. Vaginal discharge found.  Musculoskeletal: Normal range of motion. She exhibits no edema or tenderness.  Moves all extremities spontaneously   Lymphadenopathy:       Right: No inguinal adenopathy present.       Left: No inguinal adenopathy present.  Neurological: She is alert. No cranial nerve deficit. Coordination normal.  Skin: Skin is warm and dry.  Psychiatric: She has a normal mood and affect. Her behavior is normal.  Nursing note and vitals reviewed.   ED Course  Procedures  DIAGNOSTIC STUDIES: Oxygen Saturation is 99% on RA,  normal by my interpretation.    COORDINATION OF CARE: 8:52 PM Discussed treatment plan which includes lab work, symptomatic treatment, and prophylactic STD treatment with pt at bedside and pt agreed to the plan.  Labs Review-  Labs Reviewed  WET PREP, GENITAL - Abnormal; Notable for the following:    Clue Cells Wet Prep HPF POC PRESENT (*)    WBC, Wet Prep HPF POC MANY (*)    All other components within normal limits  COMPREHENSIVE METABOLIC PANEL - Abnormal; Notable for the following:    Potassium 3.0 (*)    Glucose, Bld 117 (*)    All other components within normal limits  URINALYSIS, ROUTINE W REFLEX MICROSCOPIC (NOT AT [facility]) - Abnormal; Notable for the following:    Color, Urine AMBER (*)    APPearance HAZY (*)    Specific Gravity, Urine 1.031 (*)    Bilirubin Urine SMALL (*)    All other components within normal limits  LIPASE, BLOOD  CBC  RPR  HIV ANTIBODY (ROUTINE TESTING)  POC URINE PREG, ED  GC/CHLAMYDIA PROBE AMP ([place]) NOT AT [facility]    Imaging Review No results found.  MDM   Final diagnoses:  Gastroenteritis  BV (bacterial vaginosis)   29 year old female presenting with lower abdominal pain, nausea, vomiting and diarrhea x 1 day. She works at a nursing facility with known norovirus outbreak. She is also requesting STD testing and prophylactic treatment due to possible exposure. VSS. Patient is nontoxic, nonseptic appearing, in no apparent distress. Abdomen is soft, non-tender without peritoneal signs. Vaginal discharge noted on exam with friability of the cervix  without CMT or adnexal tenderness. Patient's pain and other symptoms adequately managed in emergency department. Fluid bolus given. Labs and vitals reviewed. Patient's presentation and exam does not require further imaging. Potassium noted to be low to 3.0 which was replenished in ED. No other abnormalities on blood work. Wet prep positive for BV. Patient does not meet the SIRS or Sepsis criteria.  On repeat exam patient does not have a surgical abdomen and there are no peritoneal signs. Patient discharged home with flagyl and symptomatic treatment and given strict instructions for follow-up with their primary care physician.  I have also discussed reasons to return immediately to the ER. Patient expresses understanding and agrees with plan.   I personally performed the services described in this documentation, which was scribed in my presence. The recorded information has been reviewed and is accurate.     Rolm GalaStevi Murtaza Shell, PA-C 02/09/15 2310  Bethann BerkshireJoseph Zammit, MD 02/10/15 [phone]

## 2015-02-09 NOTE — ED Notes (Signed)
Pt. reports emesis , diarrhea , low abdominal cramping with chills and body aches onset this morning . Pt. also requesting STD screening .

## 2015-02-09 NOTE — Discharge Instructions (Signed)
Bacterial Vaginosis Bacterial vaginosis is a vaginal infection that occurs when the normal balance of bacteria in the vagina is disrupted. It results from an overgrowth of certain bacteria. This is the most common vaginal infection in women of childbearing age. Treatment is important to prevent complications, especially in pregnant women, as it can cause a premature delivery. CAUSES  Bacterial vaginosis is caused by an increase in harmful bacteria that are normally present in smaller amounts in the vagina. Several different kinds of bacteria can cause bacterial vaginosis. However, the reason that the condition develops is not fully understood. RISK FACTORS Certain activities or behaviors can put you at an increased risk of developing bacterial vaginosis, including:  Having a new sex partner or multiple sex partners.  Douching.  Using an intrauterine device (IUD) for contraception. Women do not get bacterial vaginosis from toilet seats, bedding, swimming pools, or contact with objects around them. SIGNS AND SYMPTOMS  Some women with bacterial vaginosis have no signs or symptoms. Common symptoms include:  Grey vaginal discharge.  A fishlike odor with discharge, especially after sexual intercourse.  Itching or burning of the vagina and vulva.  Burning or pain with urination. DIAGNOSIS  Your health care provider will take a medical history and examine the vagina for signs of bacterial vaginosis. A sample of vaginal fluid may be taken. Your health care provider will look at this sample under a microscope to check for bacteria and abnormal cells. A vaginal pH test may also be done.  TREATMENT  Bacterial vaginosis may be treated with antibiotic medicines. These may be given in the form of a pill or a vaginal cream. A second round of antibiotics may be prescribed if the condition comes back after treatment. Because bacterial vaginosis increases your risk for sexually transmitted diseases, getting  treated can help reduce your risk for chlamydia, gonorrhea, HIV, and herpes. HOME CARE INSTRUCTIONS   Only take over-the-counter or prescription medicines as directed by your health care provider.  If antibiotic medicine was prescribed, take it as directed. Make sure you finish it even if you start to feel better.  Tell all sexual partners that you have a vaginal infection. They should see their health care provider and be treated if they have problems, such as a mild rash or itching.  During treatment, it is important that you follow these instructions:  Avoid sexual activity or use condoms correctly.  Do not douche.  Avoid alcohol as directed by your health care provider.  Avoid breastfeeding as directed by your health care provider. SEEK MEDICAL CARE IF:   Your symptoms are not improving after 3 days of treatment.  You have increased discharge or pain.  You have a fever. MAKE SURE YOU:   Understand these instructions.  Will watch your condition.  Will get help right away if you are not doing well or get worse. FOR MORE INFORMATION  Centers for Disease Control and Prevention, Division of STD Prevention: SolutionApps.co.zawww.cdc.gov/std American Sexual Health Association (ASHA): www.ashastd.org    This information is not intended to replace advice given to you by your health care provider. Make sure you discuss any questions you have with your health care provider.   Document Released: 02/19/2005 Document Revised: 03/12/2014 Document Reviewed: 10/01/2012 Elsevier Interactive Patient Education 2016 Elsevier Inc.  Viral Gastroenteritis  Viral gastroenteritis is also known as stomach flu. This condition affects the stomach and intestinal tract. It can cause sudden diarrhea and vomiting. The illness typically lasts 3 to 8 days. Most people  develop an immune response that eventually gets rid of the virus. While this natural response develops, the virus can make you quite ill.  CAUSES  Many  different viruses can cause gastroenteritis, such as rotavirus or noroviruses. You can catch one of these viruses by consuming contaminated food or water. You may also catch a virus by sharing utensils or other personal items with an infected person or by touching a contaminated surface.  SYMPTOMS  The most common symptoms are diarrhea and vomiting. These problems can cause a severe loss of body fluids (dehydration) and a body salt (electrolyte) imbalance. Other symptoms may include:  Fever.  Headache.  Fatigue.  Abdominal pain. DIAGNOSIS  Your caregiver can usually diagnose viral gastroenteritis based on your symptoms and a physical exam. A stool sample may also be taken to test for the presence of viruses or other infections.  TREATMENT  This illness typically goes away on its own. Treatments are aimed at rehydration. The most serious cases of viral gastroenteritis involve vomiting so severely that you are not able to keep fluids down. In these cases, fluids must be given through an intravenous line (IV).  HOME CARE INSTRUCTIONS  Drink enough fluids to keep your urine clear or pale yellow. Drink small amounts of fluids frequently and increase the amounts as tolerated.  Ask your caregiver for specific rehydration instructions.  Avoid:  Foods high in sugar.  Alcohol.  Carbonated drinks.  Tobacco.  Juice.  Caffeine drinks.  Extremely hot or cold fluids.  Fatty, greasy foods.  Too much intake of anything at one time.  Dairy products until 24 to 48 hours after diarrhea stops. You may consume probiotics. Probiotics are active cultures of beneficial bacteria. They may lessen the amount and number of diarrheal stools in adults. Probiotics can be found in yogurt with active cultures and in supplements.  Wash your hands well to avoid spreading the virus.  Only take over-the-counter or prescription medicines for pain, discomfort, or fever as directed by your caregiver. Do not give aspirin to  children. Antidiarrheal medicines are not recommended.  Ask your caregiver if you should continue to take your regular prescribed and over-the-counter medicines.  Keep all follow-up appointments as directed by your caregiver. SEEK IMMEDIATE MEDICAL CARE IF:  You are unable to keep fluids down.  You do not urinate at least once every 6 to 8 hours.  You develop shortness of breath.  You notice blood in your stool or vomit. This may look like coffee grounds.  You have abdominal pain that increases or is concentrated in one small area (localized).  You have persistent vomiting or diarrhea.  You have a fever.  The patient is a child younger than 3 months, and he or she has a fever.  The patient is a child older than 3 months, and he or she has a fever and persistent symptoms.  The patient is a child older than 3 months, and he or she has a fever and symptoms suddenly get worse.  The patient is a baby, and he or she has no tears when crying. MAKE SURE YOU:  Understand these instructions.  Will watch your condition.  Will get help right away if you are not doing well or get worse. This information is not intended to replace advice given to you by your health care provider. Make sure you discuss any questions you have with your health care provider.  Document Released: 02/19/2005 Document Revised: 05/14/2011 Document Reviewed: 12/06/2010  Elsevier Interactive Patient  Education 2016 [employer].

## 2015-02-09 NOTE — ED Notes (Signed)
PA at bedside.

## 2015-02-09 NOTE — ED Notes (Signed)
PA aware of pt's heart rate.  Okay'd patient to go home.

## 2015-02-10 LAB — HIV ANTIBODY (ROUTINE TESTING W REFLEX): HIV Screen 4th Generation wRfx: NONREACTIVE

## 2015-02-10 LAB — RPR: RPR Ser Ql: NONREACTIVE

## 2015-02-10 LAB — GC/CHLAMYDIA PROBE AMP (~~LOC~~) NOT AT ARMC
CHLAMYDIA, DNA PROBE: NEGATIVE
Neisseria Gonorrhea: NEGATIVE

## 2015-06-16 IMAGING — US US PELVIS COMPLETE
1 series · 13 of 25 positions shown · non-contrast
Comparison: None.

CLINICAL DATA: Left adnexal pain. Pain all over. Fatigue, muscle
aches, dizziness, shortness of breath, headaches, nausea, diarrhea,
increased irregular heart rate, weight loss, migraines.

EXAM:
TRANSABDOMINAL AND TRANSVAGINAL ULTRASOUND OF PELVIS
TECHNIQUE: Both transabdominal and transvaginal ultrasound examinations of the
pelvis were performed. Transabdominal technique was performed for
global imaging of the pelvis including uterus, ovaries, adnexal
regions, and pelvic cul-de-sac. It was necessary to proceed with
endovaginal exam following the transabdominal exam to visualize the
endometrium and ovaries.

[Series 1: us pelvis complete · 0.17mm/px · 13 of 68 slices shown]
[im 1/68]
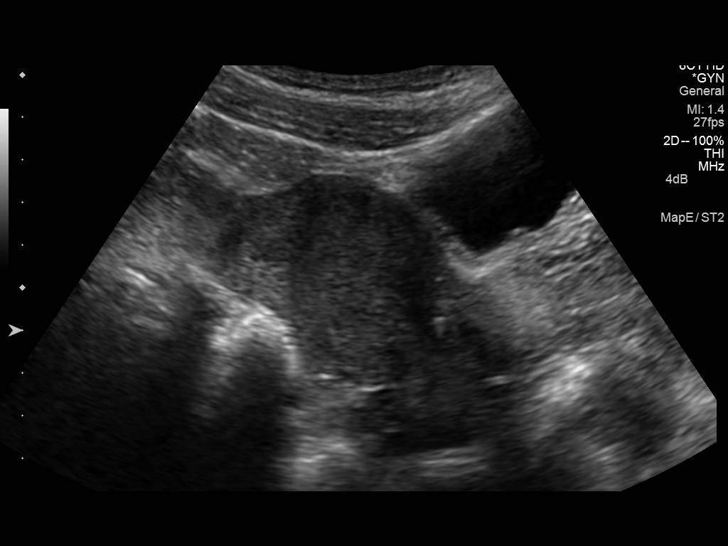
[im 6/68]
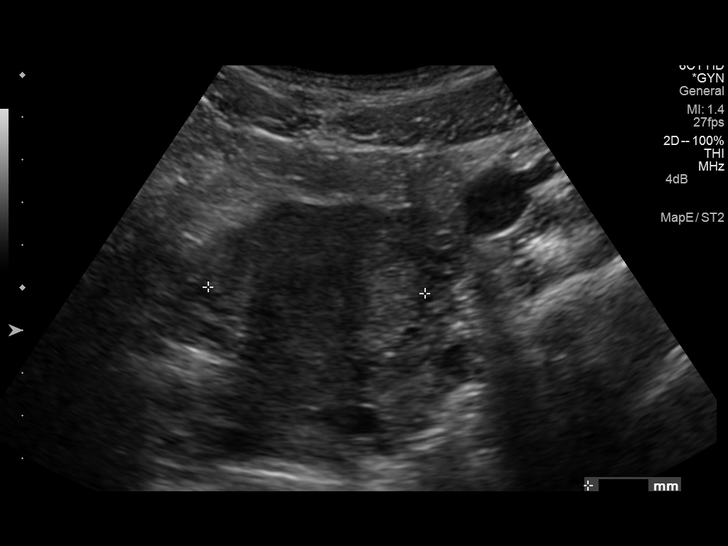
[im 12/68]
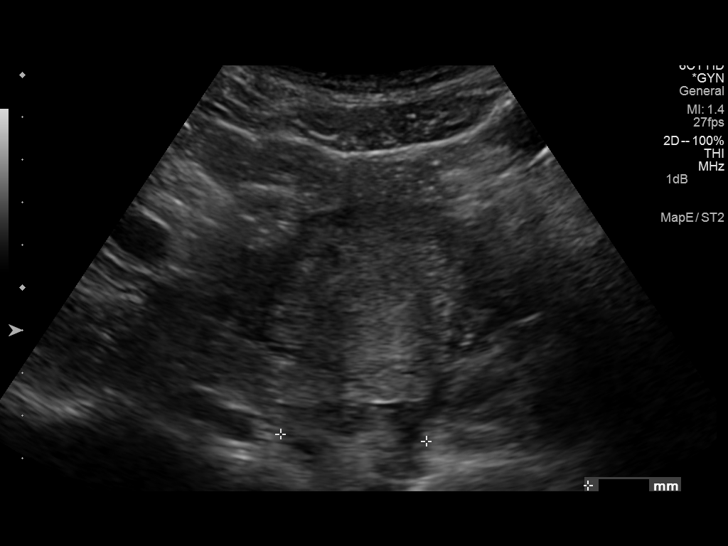
[im 17/68]
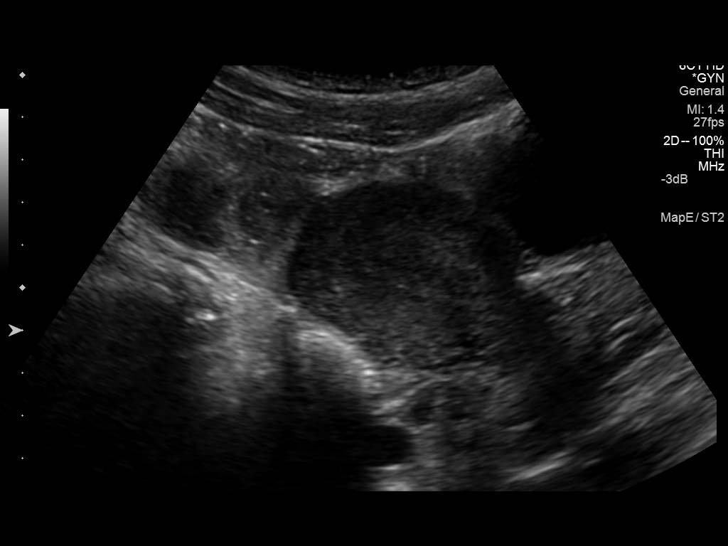
[im 23/68]
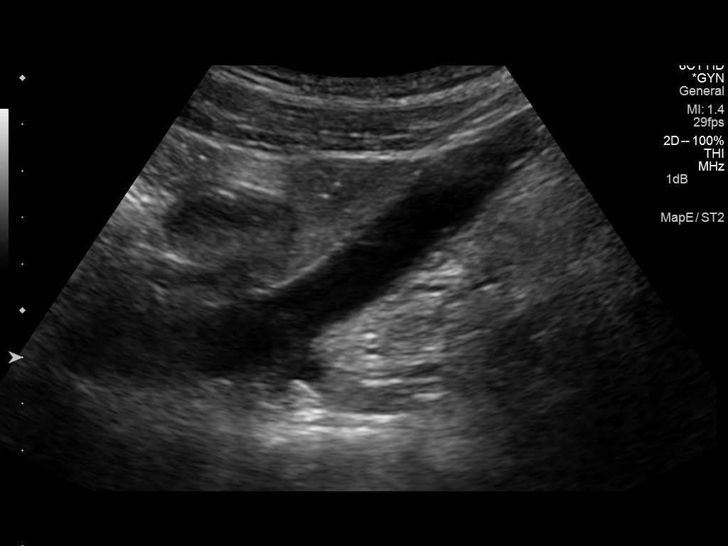
[im 28/68]
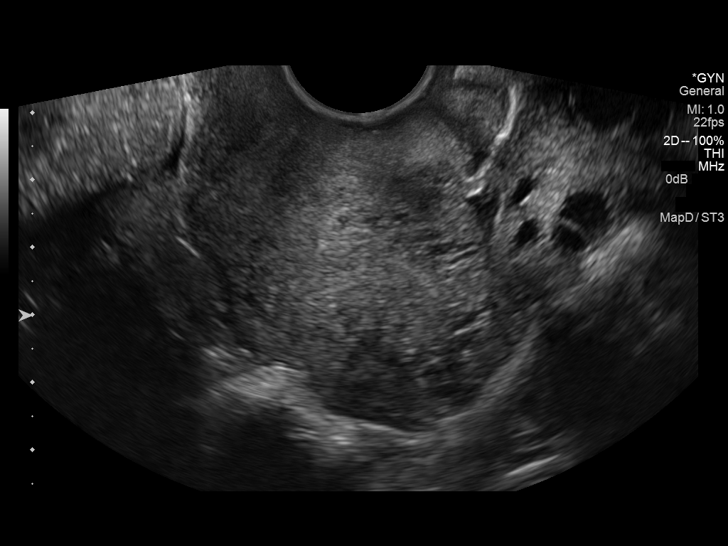
[im 34/68]
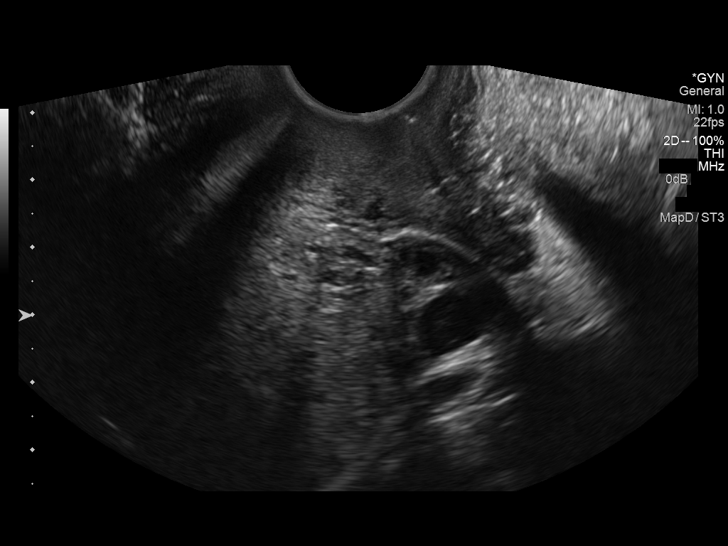
[im 40/68]
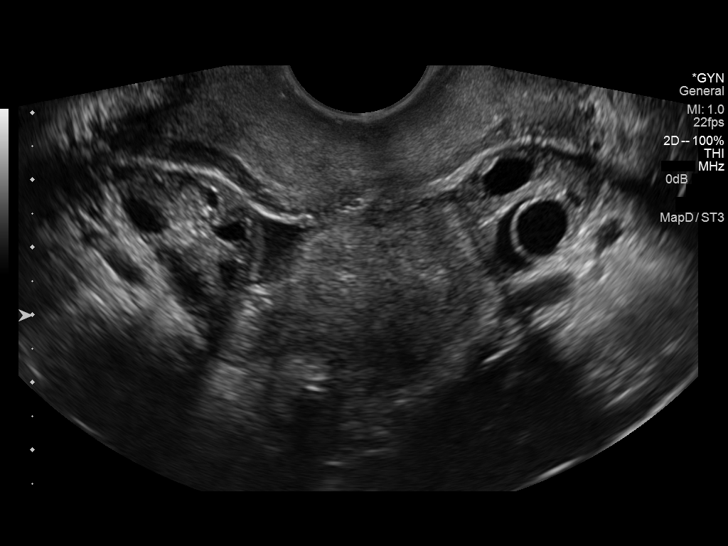
[im 45/68]
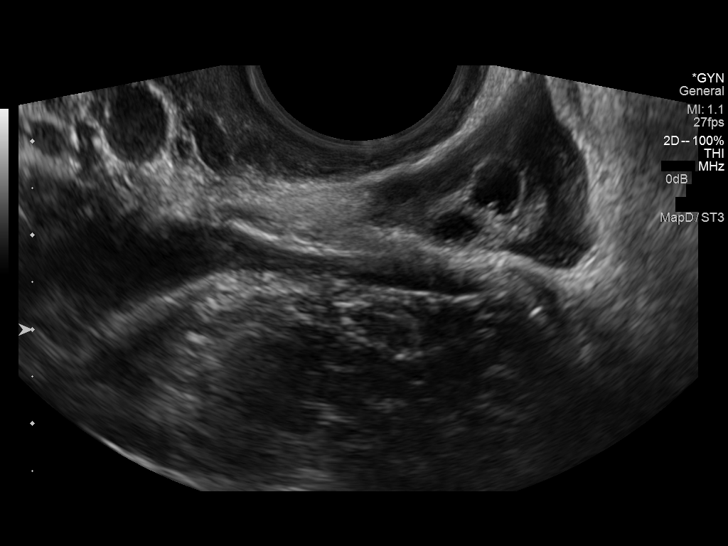
[im 51/68]
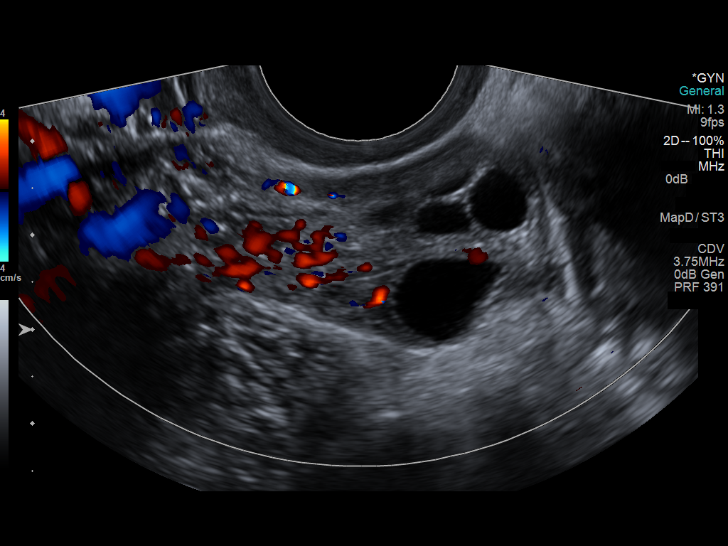
[im 56/68]
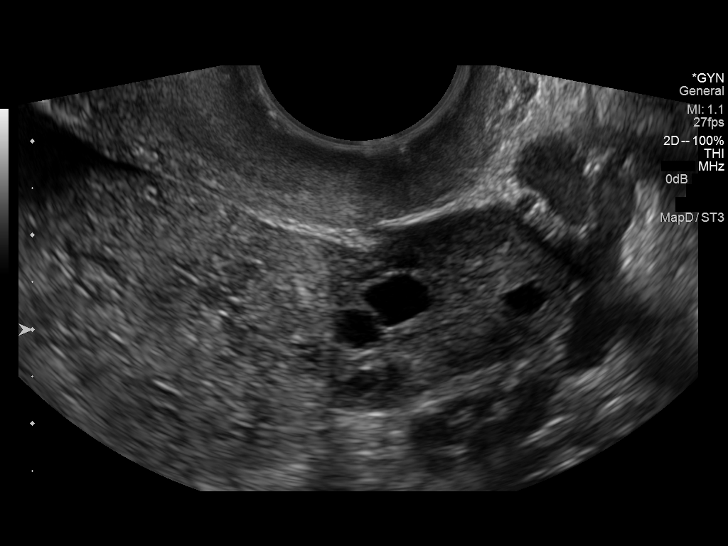
[im 62/68]
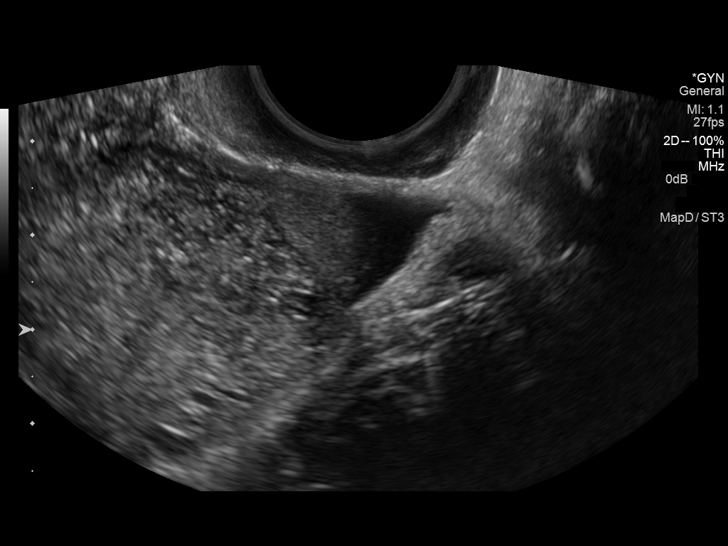
[im 68/68]
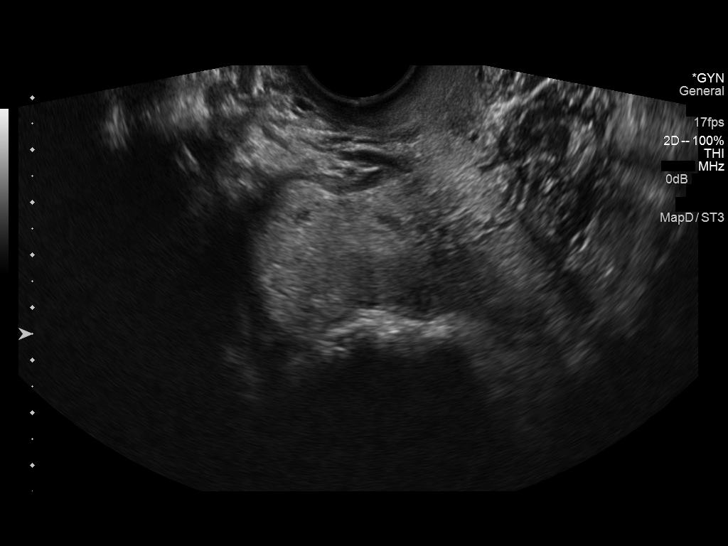

[13 of 25 positions shown; findings below may reference images not displayed]

FINDINGS: Uterus

Measurements: 8.1 x 4.8 x 5.1 cm, anteverted. No fibroids or other
mass visualized.

Endometrium

Thickness: 6 mm.  No focal abnormality visualized.

Right ovary

Measurements: 3.8 x 2.7 x 3.6 cm. Normal appearance/no adnexal mass.

Left ovary

Measurements: 4.3 x 1.7 x 2.6 cm. Normal appearance/no adnexal mass.

Other findings

Minimal free fluid in the pelvis. Prominent venous structures around
the ovaries likely representing gonadal vein varices.
IMPRESSION: Normal sound appearance of the uterus and ovaries. Gonadal vein
varices. Minimal free fluid.

## 2017-10-10 ENCOUNTER — Emergency Department (HOSPITAL_COMMUNITY)
Admission: EM | Admit: 2017-10-10 | Discharge: 2017-10-11 | Disposition: A | Discharge status: Other Acute Inpatient Hospital | Payer: Self-pay | Attending: Emergency Medicine | Admitting: Emergency Medicine

## 2017-10-10 ENCOUNTER — Other Ambulatory Visit: Payer: Self-pay

## 2017-10-10 ENCOUNTER — Encounter (HOSPITAL_COMMUNITY): Payer: Self-pay | Admitting: *Deleted

## 2017-10-10 DIAGNOSIS — Z046 Encounter for general psychiatric examination, requested by authority: Secondary | ICD-10-CM | POA: Insufficient documentation

## 2017-10-10 DIAGNOSIS — Z79899 Other long term (current) drug therapy: Secondary | ICD-10-CM | POA: Insufficient documentation

## 2017-10-10 DIAGNOSIS — I10 Essential (primary) hypertension: Secondary | ICD-10-CM | POA: Insufficient documentation

## 2017-10-10 DIAGNOSIS — F411 Generalized anxiety disorder: Secondary | ICD-10-CM | POA: Insufficient documentation

## 2017-10-10 DIAGNOSIS — F29 Unspecified psychosis not due to a substance or known physiological condition: Secondary | ICD-10-CM | POA: Insufficient documentation

## 2017-10-10 HISTORY — DX: Depression, unspecified: F32.A

## 2017-10-10 HISTORY — DX: Major depressive disorder, single episode, unspecified: F32.9

## 2017-10-10 HISTORY — DX: Anxiety disorder, unspecified: F41.9

## 2017-10-10 HISTORY — DX: Essential (primary) hypertension: I10

## 2017-10-10 LAB — URINALYSIS, ROUTINE W REFLEX MICROSCOPIC
Bilirubin Urine: NEGATIVE
Glucose, UA: NEGATIVE mg/dL
Hgb urine dipstick: NEGATIVE
Ketones, ur: 80 mg/dL — AB
Leukocytes, UA: NEGATIVE
NITRITE: NEGATIVE
Protein, ur: 100 mg/dL — AB
SPECIFIC GRAVITY, URINE: 1.036 — AB (ref 1.005–1.030)
pH: 5 (ref 5.0–8.0)

## 2017-10-10 LAB — BASIC METABOLIC PANEL
ANION GAP: 14 (ref 5–15)
BUN: 20 mg/dL (ref 6–20)
CO2: 21 mmol/L — ABNORMAL LOW (ref 22–32)
Calcium: 9.3 mg/dL (ref 8.9–10.3)
Chloride: 99 mmol/L (ref 98–111)
Creatinine, Ser: 0.92 mg/dL (ref 0.44–1.00)
GFR calc Af Amer: >60 mL/min (ref >60)
GLUCOSE: 102 mg/dL — AB (ref 70–99)
Potassium: 3.4 mmol/L — ABNORMAL LOW (ref 3.5–5.1)
Sodium: 134 mmol/L — ABNORMAL LOW (ref 135–145)

## 2017-10-10 LAB — CBC WITH DIFFERENTIAL/PLATELET
BASOS ABS: 0 10*3/uL (ref 0.0–0.1)
Basophils Relative: 0 %
EOS PCT: 0 %
Eosinophils Absolute: 0 10*3/uL (ref 0.0–0.7)
HCT: 36.4 % (ref 36.0–46.0)
Hemoglobin: 12.6 g/dL (ref 12.0–15.0)
LYMPHS PCT: 12 %
Lymphs Abs: 1.4 10*3/uL (ref 0.7–4.0)
MCH: 31.8 pg (ref 26.0–34.0)
MCHC: 34.6 g/dL (ref 30.0–36.0)
MCV: 91.9 fL (ref 78.0–100.0)
MONO ABS: 1 10*3/uL (ref 0.1–1.0)
Monocytes Relative: 8 %
Neutro Abs: 9.3 10*3/uL — ABNORMAL HIGH (ref 1.7–7.7)
Neutrophils Relative %: 80 %
PLATELETS: 265 10*3/uL (ref 150–400)
RBC: 3.96 MIL/uL (ref 3.87–5.11)
RDW: 12.2 % (ref 11.5–15.5)
WBC: 11.7 10*3/uL — ABNORMAL HIGH (ref 4.0–10.5)

## 2017-10-10 LAB — PREGNANCY, URINE: Preg Test, Ur: NEGATIVE

## 2017-10-10 LAB — RAPID URINE DRUG SCREEN, HOSP PERFORMED
AMPHETAMINES: NOT DETECTED
BENZODIAZEPINES: NOT DETECTED
Barbiturates: NOT DETECTED
Cocaine: NOT DETECTED
OPIATES: NOT DETECTED
Tetrahydrocannabinol: POSITIVE — AB

## 2017-10-10 LAB — ETHANOL

## 2017-10-10 MED ORDER — NICOTINE 21 MG/24HR TD PT24: 21.0000 mg | MEDICATED_PATCH | Freq: Every day | TRANSDERMAL | Status: DC

## 2017-10-10 MED ORDER — ALUM & MAG HYDROXIDE-SIMETH 200-200-20 MG/5ML PO SUSP: 30.0000 mL | Freq: Four times a day (QID) | ORAL | Status: DC | PRN

## 2017-10-10 MED ORDER — ZOLPIDEM TARTRATE 5 MG PO TABS: 5.0000 mg | ORAL_TABLET | Freq: Every evening | ORAL | Status: DC | PRN

## 2017-10-10 MED ORDER — ACETAMINOPHEN 325 MG PO TABS: 650.0000 mg | ORAL_TABLET | ORAL | Status: DC | PRN

## 2017-10-10 MED ORDER — ONDANSETRON HCL 4 MG PO TABS: 4.0000 mg | ORAL_TABLET | Freq: Three times a day (TID) | ORAL | Status: DC | PRN

## 2017-10-10 MED ORDER — LORAZEPAM 1 MG PO TABS
1.0000 mg | ORAL_TABLET | Freq: Once | ORAL | Status: AC
  Administered 2017-10-10: 1 mg via ORAL
  Filled 2017-10-10: qty 1

## 2017-10-10 NOTE — ED Notes (Signed)
TTS consult in progress. °

## 2017-10-10 NOTE — Progress Notes (Signed)
No suicidal/homicidal ideations, hallucinations, or substance abuse.  Stable for discharge and follow-up with outpatient resources.  Participated in the beginning part of the telepsych interview.  Nanine MeansJamison Lord, PMHNP

## 2017-10-10 NOTE — ED Triage Notes (Signed)
Pt brought in by [facility] with complaints of confusion and "tissues coming out" per EMS. EMS was called out originally for the son "not acting right" and EMS checked him out and felt everything was normal for son and during his assessment realized that pt was confused and talking out of her head. Pt was able to answer questions to who she was, where she was and who the president was but EMS said she seemed very "spacy" and was saying off the walls statements. Pt stated to EMS "I feel like I've been abducted". Pt reports she has hx of blood pressure and anxiety and has been off her meds but recently starting taking them again. Pt denies illegal drug use and taking too much of her medication.

## 2017-10-10 NOTE — [facility] Counselor (Signed)
Per further investigation of patient mental health status, patient disposition has been changed to inpatient treatment per Nanine MeansJamison Lord, DNP.

## 2017-10-10 NOTE — ED Notes (Signed)
Pt's son's father arrived. Pt's son stood up when father walked into room and appeared to be well attached to him and wanting to go with him. Son immediately walked out of the room with father and without speaking to mother. Pt's son's father stated to pt, "call us when you can get out of here". Father hugged son and they left together.

## 2017-10-10 NOTE — ED Notes (Signed)
Pt placed in paper psych scrubs.

## 2017-10-10 NOTE — ED Notes (Signed)
Pt cannot provide urine sample at this time.  

## 2017-10-10 NOTE — ED Provider Notes (Signed)
[facility] EMERGENCY DEPARTMENT Provider Note   CSN: [id number] Arrival date & time: 10/10/17  1022     History   Chief Complaint Chief Complaint  Patient presents with  . Altered Mental Status    HPI Monica Byrd is a 32 y.o. female.  Level 5 caveat for psychiatric condition.  Patient states there is "something coming out of my vagina that starts in my stomach".  EMS was notified by the son who stated she was "not acting right".  EMS noticed patient was confused and "talking out of her head".  She also stated "I feel like I've been abducted".  She has a history of anxiety, depression, schizoaffective disorder, posttraumatic stress disorder.  She states she has been admitted to a psychiatric facility in the past.     Past Medical History:  Diagnosis Date  . Anxiety   . Depression   . Hypertension     Patient Active Problem List   Diagnosis Date Noted  . Posttraumatic stress disorder 07/28/2012  . Schizoaffective disorder (HCC) 07/28/2012    History reviewed. No pertinent surgical history.   OB History   None      Home Medications    Prior to Admission medications   Medication Sig Start Date End Date Taking? Authorizing Provider  acetaminophen (TYLENOL) 500 MG tablet Take 500 mg by mouth every 6 (six) hours as needed for moderate pain.   Yes [provider]  busPIRone (BUSPAR) 5 MG tablet Take 1 tablet by mouth 3 (three) times daily. 07/17/17  Yes [provider]  escitalopram (LEXAPRO) 10 MG tablet Take 1 tablet by mouth daily. 07/17/17  Yes [provider]  Multiple Vitamin (MULTIVITAMIN WITH MINERALS) TABS tablet Take 1 tablet by mouth daily.   Yes [provider]  metroNIDAZOLE (FLAGYL) 500 MG tablet Take 1 tablet (500 mg total) by mouth 2 (two) times daily. Patient not taking: Reported on 10/10/2017 02/09/15   Barrett, Rolm Gala, PA-C  ondansetron (ZOFRAN) 4 MG tablet Take 1 tablet (4 mg total) by mouth every 6 (six)  hours. Patient not taking: Reported on 10/10/2017 02/09/15   Barrett, Rolm Gala, PA-C    Family History No family history on file.  Social History Social History   Tobacco Use  . Smoking status: Never Smoker  . Smokeless tobacco: Never Used  Substance Use Topics  . Alcohol use: Yes    Comment: 1-2 drinks occasionally   . Drug use: Yes    Types: Marijuana    Comment: pt currently denies as of 10/10/17     Allergies   Patient has no known allergies.   Review of Systems Review of Systems  Unable to perform ROS: Psychiatric disorder     Physical Exam Updated Vital Signs   Physical Exam  Constitutional: She is oriented to person, place, and time.  Does not answer questions directly  HENT:  Head: Normocephalic and atraumatic.  Eyes: Conjunctivae are normal.  Neck: Neck supple.  Cardiovascular: Normal rate and regular rhythm.  Pulmonary/Chest: Effort normal and breath sounds normal.  Abdominal: Soft. Bowel sounds are normal.  Musculoskeletal: Normal range of motion.  Neurological: She is alert and oriented to person, place, and time.  Skin: Skin is warm and dry.  Psychiatric:  Flight of ideas; tangential thinking  Nursing note and vitals reviewed.    ED Treatments / Results  Labs (all labs ordered are listed, but only abnormal results are displayed) Labs Reviewed  CBC WITH DIFFERENTIAL/PLATELET - Abnormal; Notable for the  following components:      Result Value   WBC 11.7 (*)    Neutro Abs 9.3 (*)    All other components within normal limits  BASIC METABOLIC PANEL - Abnormal; Notable for the following components:   Sodium 134 (*)    Potassium 3.4 (*)    CO2 21 (*)    Glucose, Bld 102 (*)    All other components within normal limits  URINALYSIS, ROUTINE W REFLEX MICROSCOPIC - Abnormal; Notable for the following components:   APPearance HAZY (*)    Specific Gravity, Urine 1.036 (*)    Ketones, ur 80 (*)    Protein, ur 100 (*)    Bacteria, UA RARE (*)    All  other components within normal limits  RAPID URINE DRUG SCREEN, HOSP PERFORMED - Abnormal; Notable for the following components:   Tetrahydrocannabinol POSITIVE (*)    All other components within normal limits  PREGNANCY, URINE  ETHANOL    EKG None  Radiology No results found.  Procedures Procedures (including critical care time)  Medications Ordered in ED Medications - No data to display   Initial Impression / Assessment and Plan / ED Course  I have reviewed the triage vital signs and the nursing notes.  Pertinent labs & imaging results that were available during my care of the patient were reviewed by me and considered in my medical decision making (see chart for details).     Patient presents with flight of ideas and tangential thinking.  I believe she is psychotic.  We called the patient's husband who is able to take her child home.  The husband appeared to be normal in his thinking.  We will consult behavioral health for admission.  Final Clinical Impressions(s) / ED Diagnoses   Final diagnoses:  Psychosis, unspecified psychosis type [facility](HCC)    ED Discharge Orders    None       Donnetta Hutchingook, Williamson Cavanah, MD 10/10/17 1342

## 2017-10-10 NOTE — ED Notes (Addendum)
Pt's son is asking RN if he can sit outside the room because he doesn't want to be in the room right now with mom. EDP notified of situation. Mom was asked if we could call someone to pick up her son and she said her son's father, [place] at [phone]. Pt gave verbal consent for her son to leave with his father, [place]ico. Pt called [place]ico and said he was coming to pick up pt's son. RN called [place] and verified this information and he said he would be here to pick him up in about 1-2 minutes. Pt's son said he lives with mom and dad, but they are not together. Pt's son reports he sees dad on a daily basis and feels safe going home with him. RN awaiting arrival of father. Charge RN aware.

## 2017-10-10 NOTE — Progress Notes (Signed)
Disposition CSW asked to contact [facility] ED with Nanine MeansJamison Lord, NP's, recommondation for discharge.  [facility] ED Nurse, Morrison OldMeagan W., expressed concern as patient is confused and EDP was considering IVCing patient.  This Clinical research associatewriter suggested that NP Lord be contacted directly and gave on-call number to pt's nurse.  Timmothy EulerJean T. Kaylyn LimSutter, MSW, LCSWA Disposition Clinical Social Work [phone] (cell) [phone] (office)
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
## 2017-10-10 NOTE — Progress Notes (Signed)
[facility] CSW [facility] staff and asked them to review pt's chart for possible admission.  CSW will continue to follow for placement elsewhere if [facility].  Timmothy EulerJean T. Kaylyn LimSutter, MSW, LCSWA Disposition Clinical Social Work [phone] (cell) [phone] (office)

## 2017-10-11 ENCOUNTER — Other Ambulatory Visit: Payer: Self-pay

## 2017-10-11 ENCOUNTER — Encounter (HOSPITAL_COMMUNITY): Payer: Self-pay

## 2017-10-11 ENCOUNTER — Encounter (HOSPITAL_COMMUNITY): Payer: Self-pay | Admitting: Emergency Medicine

## 2017-10-11 ENCOUNTER — Inpatient Hospital Stay (HOSPITAL_COMMUNITY)
Admission: AD | Admit: 2017-10-11 | Discharge: 2017-10-21 | DRG: 885 | Disposition: A | Discharge status: Home / Self Care | Payer: Federal, State, Local not specified - Other | Source: Intra-hospital | Attending: Psychiatry | Admitting: Psychiatry

## 2017-10-11 DIAGNOSIS — E876 Hypokalemia: Secondary | ICD-10-CM | POA: Diagnosis not present

## 2017-10-11 DIAGNOSIS — F4310 Post-traumatic stress disorder, unspecified: Secondary | ICD-10-CM | POA: Diagnosis present

## 2017-10-11 DIAGNOSIS — I10 Essential (primary) hypertension: Secondary | ICD-10-CM | POA: Diagnosis present

## 2017-10-11 DIAGNOSIS — Z91411 Personal history of adult psychological abuse: Secondary | ICD-10-CM

## 2017-10-11 DIAGNOSIS — Z91410 Personal history of adult physical and sexual abuse: Secondary | ICD-10-CM | POA: Diagnosis not present

## 2017-10-11 DIAGNOSIS — Z79899 Other long term (current) drug therapy: Secondary | ICD-10-CM

## 2017-10-11 DIAGNOSIS — Z818 Family history of other mental and behavioral disorders: Secondary | ICD-10-CM | POA: Diagnosis not present

## 2017-10-11 DIAGNOSIS — R825 Elevated urine levels of drugs, medicaments and biological substances: Secondary | ICD-10-CM | POA: Diagnosis present

## 2017-10-11 DIAGNOSIS — F250 Schizoaffective disorder, bipolar type: Secondary | ICD-10-CM | POA: Diagnosis present

## 2017-10-11 DIAGNOSIS — F419 Anxiety disorder, unspecified: Secondary | ICD-10-CM | POA: Diagnosis present

## 2017-10-11 DIAGNOSIS — F410 Panic disorder [episodic paroxysmal anxiety] without agoraphobia: Secondary | ICD-10-CM | POA: Diagnosis present

## 2017-10-11 DIAGNOSIS — R000 Tachycardia, unspecified: Secondary | ICD-10-CM | POA: Diagnosis not present

## 2017-10-11 DIAGNOSIS — F251 Schizoaffective disorder, depressive type: Secondary | ICD-10-CM | POA: Diagnosis not present

## 2017-10-11 DIAGNOSIS — R4182 Altered mental status, unspecified: Secondary | ICD-10-CM | POA: Diagnosis present

## 2017-10-11 DIAGNOSIS — G4700 Insomnia, unspecified: Secondary | ICD-10-CM | POA: Diagnosis present

## 2017-10-11 DIAGNOSIS — R4189 Other symptoms and signs involving cognitive functions and awareness: Secondary | ICD-10-CM | POA: Diagnosis present

## 2017-10-11 DIAGNOSIS — R45851 Suicidal ideations: Secondary | ICD-10-CM | POA: Diagnosis present

## 2017-10-11 MED ORDER — ALUM & MAG HYDROXIDE-SIMETH 200-200-20 MG/5ML PO SUSP: 30.0000 mL | ORAL | Status: DC | PRN

## 2017-10-11 MED ORDER — LORAZEPAM 2 MG/ML IJ SOLN
1.0000 mg | Freq: Four times a day (QID) | INTRAMUSCULAR | Status: DC | PRN
  Administered 2017-10-11: 1 mg via INTRAMUSCULAR
  Filled 2017-10-11: qty 1

## 2017-10-11 MED ORDER — MAGNESIUM HYDROXIDE 400 MG/5ML PO SUSP: 30.0000 mL | Freq: Every day | ORAL | Status: DC | PRN

## 2017-10-11 MED ORDER — LORAZEPAM 1 MG PO TABS
1.0000 mg | ORAL_TABLET | Freq: Four times a day (QID) | ORAL | Status: DC | PRN
  Filled 2017-10-11: qty 1

## 2017-10-11 MED ORDER — HALOPERIDOL 5 MG PO TABS
5.0000 mg | ORAL_TABLET | Freq: Four times a day (QID) | ORAL | Status: DC | PRN
Start: 2017-10-11 — End: 2017-10-21
  Filled 2017-10-11: qty 1

## 2017-10-11 MED ORDER — HALOPERIDOL LACTATE 5 MG/ML IJ SOLN
5.0000 mg | Freq: Four times a day (QID) | INTRAMUSCULAR | Status: DC | PRN
  Administered 2017-10-11: 5 mg via INTRAMUSCULAR
  Filled 2017-10-11: qty 1

## 2017-10-11 MED ORDER — ACETAMINOPHEN 325 MG PO TABS: 650.0000 mg | ORAL_TABLET | Freq: Four times a day (QID) | ORAL | Status: DC | PRN

## 2017-10-11 MED ORDER — HYDROXYZINE HCL 25 MG PO TABS
25.0000 mg | ORAL_TABLET | Freq: Three times a day (TID) | ORAL | Status: DC | PRN
Start: 2017-10-11 — End: 2017-10-21
  Administered 2017-10-12: 25 mg via ORAL
  Filled 2017-10-11: qty 1

## 2017-10-11 MED ORDER — DIPHENHYDRAMINE HCL 50 MG/ML IJ SOLN
25.0000 mg | Freq: Four times a day (QID) | INTRAMUSCULAR | Status: DC | PRN
  Administered 2017-10-11: 25 mg via INTRAMUSCULAR
  Filled 2017-10-11: qty 1

## 2017-10-11 MED ORDER — DIPHENHYDRAMINE HCL 25 MG PO CAPS
25.0000 mg | ORAL_CAPSULE | Freq: Four times a day (QID) | ORAL | Status: DC | PRN
  Filled 2017-10-11: qty 1

## 2017-10-11 MED ORDER — TRAZODONE HCL 50 MG PO TABS
50.0000 mg | ORAL_TABLET | Freq: Every evening | ORAL | Status: DC | PRN
  Filled 2017-10-11: qty 7

## 2017-10-11 NOTE — ED Notes (Signed)
Pt skin on face and hands appear intact with no redness at this time.

## 2017-10-11 NOTE — Tx Team (Signed)
Initial Treatment Plan 10/11/2017 3:59 PM Monica Gottronnastacia N Estrada EAV:[id number]    PATIENT STRESSORS: Medication change or noncompliance Other: TBD   PATIENT STRENGTHS: Motivation for treatment/growth Other: TBD   PATIENT IDENTIFIED PROBLEMS: Patient unable to participate in admission.                     DISCHARGE CRITERIA:  Improved stabilization in mood, thinking, and/or behavior  PRELIMINARY DISCHARGE PLAN: Outpatient therapy  PATIENT/FAMILY INVOLVEMENT: This treatment plan has been presented to and reviewed with the patient, Monica GottronAnastacia N Byrd.  The patient and family have been given the opportunity to ask questions and make suggestions.  Maurine SimmeringShugart, [place] M, RN 10/11/2017, 3:59 PM

## 2017-10-11 NOTE — ED Notes (Signed)
Patient is currently showering.

## 2017-10-11 NOTE — ED Notes (Signed)
I offered pt to take a shower because she said "it stinks". I asked what stinks and she said, "my breath". After gathering her things she needs for her shower, she refused to go to the shower. Pt said she wants to go home & take a shower. Pt given toothbrush & toothpaste to brush teeth.

## 2017-10-11 NOTE — ED Notes (Signed)
Report to Karen, RN

## 2017-10-11 NOTE — ED Notes (Signed)
Poison control called  Instructed nurse to have patient rinse face and mouth with water. No further follow up needed.

## 2017-10-11 NOTE — ED Notes (Signed)
IVC paperwork being completed by Dr. Adriana Simasook.

## 2017-10-11 NOTE — ED Notes (Signed)
[facility]  [facility] for [facility] DC

## 2017-10-11 NOTE — ED Notes (Addendum)
Pt walking in hallway with sitter. Pt walked to back hallway and attempted to go out ED doors near Rm 11. PT easily redirected back to room. Consulting civil engineerCharge RN notified. Pt calm and cooperative at this time.

## 2017-10-11 NOTE — ED Notes (Signed)
Pt wandering out of room. Pt redirected back to her room.

## 2017-10-11 NOTE — ED Notes (Signed)
Pt was able to open one cabinet and grabbed the bleach wipes and stuck some of them in her mouth/licked wipes and wiped her face. Bleach wipes removed from patient's mouth and patient rinsed mouth out. All materials removed from the one cabinet that would not lock properly. Press photographerCharge nurse and EDP notified.

## 2017-10-11 NOTE — Progress Notes (Signed)
Patient has exhibited bizarre behavior but was remaining in her room, but she then began to come out of her room wearing only a towel and was not redirectable. She would not put on clothing. She was offered assistance but refused. She was offered PO PRN medication, which she spat out. PO IM Haldol, Ativan, and Benadryl was administered with show of support, with results pending.

## 2017-10-11 NOTE — Progress Notes (Signed)
[facility] Bed [facility]1 [name], NP is the accepting provider.  [name], MD, is the attending provider.  Call report to [phone]  [name] notified.   Pt is IVC .  Pt may be transported by [employer] Pt [facility]  [facility]  Carney BernJean T. Kaylyn LimSutter, MSW, LCSWA Disposition Clinical Social Work [phone] (cell) [phone] (office)

## 2017-10-11 NOTE — Progress Notes (Signed)
Admission note:  Monica Byrd did not participate in the admission process. She was internally preoccupied, with minimal speech, and appeared distressed while in the search room. She required much prompting in order to get vitals. Getting her to allow a skin search -- and change into the gown -- took at least 20 minutes -- with much cajoling from this writer and MHT. Monica Byrd did not respond to questions and was not willing to sign paperwork. Her skin assessment was unremarkable, with no breakdown in skin integrity noted.   Per report, she was also confused and couldn't answer questions at the [facility]. She had told someone that she felt like "something is coming out of her vagina."   Pt was oriented to her room and given fresh scrubs, toiletries. Patient was noted to be malodorous, and she did appear to want to shower. When pt got in her room, she began retching, but no vomitus was produced. Will continue to monitor for needs/safety.

## 2017-10-11 NOTE — ED Notes (Signed)
[facility] [employer] [facility]

## 2017-10-11 NOTE — ED Notes (Addendum)
Pt given breakfast tray but refused to eat

## 2017-10-11 NOTE — ED Notes (Signed)
Pt admits that she sometimes hears things others and is hearing voices now.  She appears to be responding to internal stimuli  Reports previous admit 3 years ago  No outpt treatment  She is frightened, anxious and appears at room doorway several times

## 2017-10-12 DIAGNOSIS — F251 Schizoaffective disorder, depressive type: Secondary | ICD-10-CM

## 2017-10-12 LAB — URINALYSIS, ROUTINE W REFLEX MICROSCOPIC
BACTERIA UA: NONE SEEN
Bilirubin Urine: NEGATIVE
GLUCOSE, UA: NEGATIVE mg/dL
HGB URINE DIPSTICK: NEGATIVE
KETONES UR: 20 mg/dL — AB
Nitrite: NEGATIVE
PROTEIN: NEGATIVE mg/dL
Specific Gravity, Urine: 1.024 (ref 1.005–1.030)
pH: 6 (ref 5.0–8.0)

## 2017-10-12 MED ORDER — OLANZAPINE 5 MG PO TBDP
5.0000 mg | ORAL_TABLET | Freq: Two times a day (BID) | ORAL | Status: DC
  Administered 2017-10-12 – 2017-10-14 (×4): 5 mg via ORAL
  Filled 2017-10-12 (×8): qty 1

## 2017-10-12 MED ORDER — ESCITALOPRAM OXALATE 5 MG PO TABS
5.0000 mg | ORAL_TABLET | Freq: Every day | ORAL | Status: DC
  Administered 2017-10-13 – 2017-10-14 (×2): 5 mg via ORAL
  Filled 2017-10-12 (×4): qty 1

## 2017-10-12 NOTE — Progress Notes (Signed)
D. Pt presents with an anxious affect and paranoid behavior-Pt appeared frightened standing outside her bathroom door with a towel wrapped around her waist upon initial approach. Pt finished dressing with encouragement. Pt appears confused as to why she is here and is concerned about her son's safety. Pt currently denies SI/HI and AV hallucinations. A. Labs and vitals monitored. Pt given prn medication for anxiety. Sat with pt 1:1 -supported pt emotionally-reassured pt that she is safe and encouraged to express concerns and ask questions.   R. Pt remains safe with 15 minute checks. Will continue POC.

## 2017-10-12 NOTE — Progress Notes (Signed)
D:  Monica Byrd has been asleep all shift.  She woke up at 0640.  RN assisted her in calling her mother so she can given her the code number.  She is very guarded, confused and paranoid.  She wanted bottled water but was hesitant about taking ice water in a cup but did accept it.  She kept reporting, "I don't know how I got here."  Affect is anxious and frightened.  She denied SI/HI or A/V hallucinations.  She has some thought blocking noted.  She is upset that she is here and wants to be with her son.  A:  1:1 with RN for support and encouragement.  Medications as ordered.  Q 15 minute checks maintained for safety.  Encouraged participation in group and unit activities.   R:  [name] remains safe on the unit.  We will continue to monitor the progress towards her goals.
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
## 2017-10-12 NOTE — [facility] Group Notes (Signed)
[facility] Group Notes:  (Nursing/MHT/Case Management/Adjunct)  Date:  10/12/2017  Time:  4:57 PM  Type of Therapy:  Nurse Education  Participation Level:  Did Not Attend  Summary of Progress/Problems: In this group, we explored meditation by listening to and practicing a guided meditation. We then discussed where each patient was born, and tried to spark conversation. The patient did not attend group.  Kirstie MirzaJonathan C Kay Ricciuti 10/12/2017, 4:57 PM

## 2017-10-12 NOTE — Progress Notes (Signed)
Pt is quiet, gaurded and suspicious. Pt is slow moving and is confused at times. Pt denies any pain or discomfort.  Pt has no scheduled meds and refuses anything for sleep.  Pt attends group with minimal participation.  Pt does deny SI, HI and AVH.  Pt verbally contracts for safety. Pt expressed no needs at this time. Pt remains safe on unit.
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
## 2017-10-12 NOTE — [facility] Group Notes (Signed)
[facility] Group Notes: (Clinical Social Work)   10/12/2017      Type of Therapy:  Group Therapy   Participation Level:  Did Not Attend despite MHT prompting   Ambrose MantleMareida Grossman-Orr, LCSW 10/12/2017, 1:25 PM

## 2017-10-12 NOTE — [facility] Suicide Risk Assessment (Signed)
[facility]   Nursing information obtained from:  Review of record Demographic factors:   patient and chart  Current Mental Status:   32 year old , single, has an 30 year old son, currently with the father, had been living with father of child prior to admission, employed as CNA Loss Factors:   none endorsed  Historical Factors:   depression Risk Reduction Factors:   resilience   Total Time spent with patient: 45 minutes Principal Problem: Schizoaffective disorder (HCC)- depressed  Diagnosis:   Patient Active Problem List   Diagnosis Date Noted  . Posttraumatic stress disorder [F43.10] 07/28/2012  . Schizoaffective disorder (HCC) [F25.9] 07/28/2012   Subjective Data:   Continued Clinical Symptoms:    The "Alcohol Use Disorders Identification Test", Guidelines for Use in Primary Care, Second Edition.  World [employer] [facility](WHO). Score between 0-7:  no or low risk or alcohol related problems. Score between 8-15:  moderate risk of alcohol related problems. Score between 16-19:  high risk of alcohol related problems. Score 20 or above:  warrants further diagnostic evaluation for alcohol dependence and treatment.   CLINICAL FACTORS:  32 year old female, employed, has an 30 year old child, who is currently with the father.  Patient was brought to ED via EMS . Originally EMS had been contacted for patient's son, but as per notes EMS deemed that child was OK, doing well, but that patient seemed confused , psychotic, internally preoccupied , reporting she felt she had been abducted. On admission to ED patient seemed fearful, confused and reported she felt that everybody could hear her, and that " something is coming out of my vagina that starts in my stomach".  She denies alcohol or dug abuse- admission BAL negative, admission UDS positive for Cannabis  Today patient presents as fair historian, appears depressed, vaguely anxious, guarded. States she felt "  like everything was crumbling around me", but does not elaborate further. Endorses some sadness, anhedonia, poor sleep prior to admission. Today denies hallucinations.  Patient endorses history of depression. As per chart patient has history of a prior psychiatric admission in May 2014 for depression, psychosis, at the time was diagnosed with Schizoaffective Disorder and PTSD, discharged on Prozac and Risperidone . She denies history of suicidal attempts . Prior to this admission she was prescribed Buspar and Lexapro- at this time it is unclear if she was compliant with medication regimen.  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  Patient has been started on Zyprexa Zydis 5 mgrs BID, would also continue Lexapro 5 mgrs QDAY initially    Musculoskeletal: Strength & Muscle Tone: within normal limits Gait & Station: normal Patient leans: N/A  Psychiatric Specialty Exam: Physical Exam  ROS denies chest pain, no shortness of breath, no vomiting   Blood pressure (!) 130/92, pulse (!) 115, temperature 99.5 F (37.5 C), temperature source Oral, resp. rate 19, SpO2 99 %.There is no height or weight on file to calculate BMI.  General Appearance: Fairly Groomed  Eye Contact:  Fair  Speech:  slow  Volume:  Decreased  Mood:  depressed, anxious   Affect:  seems constricted, vaguely anxious   Thought Process:  Disorganized and Descriptions of Associations: Circumstantial  Orientation:  Other:  fully alert and attentive  Thought Content:  denies hallucinations at this time, and currently not internally preoccupied, no delusions expressed, but presents guarded , fearful and states she thinks she will  never be discharged   Suicidal Thoughts:  No at this time denies suicidal ideations, denies self injurious ideations, no homicidal or violent ideations  Homicidal Thoughts:  No  Memory:  recent and remote fair   Judgement:  Other:   liimited   Insight:  Lacking  Psychomotor Activity:  Decreased  Concentration:  Concentration: Fair and Attention Span: Fair  Recall:  [employer] of Knowledge:  Fair  Language:  Fair  Akathisia:  Negative  Handed:  Right  AIMS (if indicated):     Assets:  Desire for Improvement Resilience  ADL's:  Intact  Cognition:  WNL  Sleep:  Number of Hours: 5.75      COGNITIVE FEATURES THAT CONTRIBUTE TO RISK:  Closed-mindedness and Loss of executive function    SUICIDE RISK:   Moderate:  Frequent suicidal ideation with limited intensity, and duration, some specificity in terms of plans, no associated intent, good self-control, limited dysphoria/symptomatology, some risk factors present, and identifiable protective factors, including available and accessible social support.  PLAN OF CARE: Patient will be admitted to inpatient psychiatric unit for stabilization and safety. Will provide and encourage milieu participation. Provide medication management and maked adjustments as needed.  Will follow daily.    I certify that inpatient services furnished can reasonably be expected to improve the patient's condition.   Craige Cotta, MD 10/12/2017, 4:41 PM

## 2017-10-13 LAB — COMPREHENSIVE METABOLIC PANEL
ALK PHOS: 61 U/L (ref 38–126)
ALT: 29 U/L (ref 0–44)
AST: 54 U/L — ABNORMAL HIGH (ref 15–41)
Albumin: 4.1 g/dL (ref 3.5–5.0)
Anion gap: 10 (ref 5–15)
BUN: 25 mg/dL — ABNORMAL HIGH (ref 6–20)
CALCIUM: 9.5 mg/dL (ref 8.9–10.3)
CO2: 26 mmol/L (ref 22–32)
CREATININE: 1.17 mg/dL — AB (ref 0.44–1.00)
Chloride: 107 mmol/L (ref 98–111)
Glucose, Bld: 98 mg/dL (ref 70–99)
Potassium: 3.3 mmol/L — ABNORMAL LOW (ref 3.5–5.1)
Sodium: 143 mmol/L (ref 135–145)
TOTAL PROTEIN: 7.9 g/dL (ref 6.5–8.1)
Total Bilirubin: 0.8 mg/dL (ref 0.3–1.2)

## 2017-10-13 LAB — CBC WITH DIFFERENTIAL/PLATELET
Basophils Absolute: 0 10*3/uL (ref 0.0–0.1)
Basophils Relative: 0 %
EOS PCT: 1 %
Eosinophils Absolute: 0.1 10*3/uL (ref 0.0–0.7)
HCT: 37.6 % (ref 36.0–46.0)
Hemoglobin: 13.1 g/dL (ref 12.0–15.0)
LYMPHS ABS: 2.6 10*3/uL (ref 0.7–4.0)
Lymphocytes Relative: 33 %
MCH: 32 pg (ref 26.0–34.0)
MCHC: 34.8 g/dL (ref 30.0–36.0)
MCV: 91.9 fL (ref 78.0–100.0)
Monocytes Absolute: 0.5 10*3/uL (ref 0.1–1.0)
Monocytes Relative: 7 %
Neutro Abs: 4.7 10*3/uL (ref 1.7–7.7)
Neutrophils Relative %: 59 %
PLATELETS: 296 10*3/uL (ref 150–400)
RBC: 4.09 MIL/uL (ref 3.87–5.11)
RDW: 12.5 % (ref 11.5–15.5)
WBC: 7.9 10*3/uL (ref 4.0–10.5)

## 2017-10-13 LAB — LIPID PANEL
Cholesterol: 168 mg/dL (ref 0–200)
HDL: 48 mg/dL (ref >40)
LDL CALC: 108 mg/dL — AB (ref 0–99)
Total CHOL/HDL Ratio: 3.5 RATIO
Triglycerides: 58 mg/dL (ref <150)
VLDL: 12 mg/dL (ref 0–40)

## 2017-10-13 LAB — TSH: TSH: 4.399 u[IU]/mL (ref 0.350–4.500)

## 2017-10-13 MED ORDER — POTASSIUM CHLORIDE CRYS ER 20 MEQ PO TBCR
20.0000 meq | EXTENDED_RELEASE_TABLET | Freq: Two times a day (BID) | ORAL | Status: DC
  Administered 2017-10-13 – 2017-10-21 (×17): 20 meq via ORAL
  Filled 2017-10-13 (×22): qty 1
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
## 2017-10-13 NOTE — [facility] Group Notes (Signed)
[facility] LCSW Group Therapy Note  Date/Time:  10/13/2017  11:00AM-12:00PM  Type of Therapy and Topic:  Group Therapy:  Music and Mood  Participation Level:  Minimal   Description of Group: In this process group, members listened to a variety of genres of music and identified that different types of music evoke different responses.  Patients were encouraged to identify music that was soothing for them and music that was energizing for them.  Patients discussed how this knowledge can help with wellness and recovery in various ways including managing depression and anxiety as well as encouraging healthy sleep habits.    Therapeutic Goals: 1. Patients will explore the impact of different varieties of music on mood 2. Patients will verbalize the thoughts they have when listening to different types of music 3. Patients will identify music that is soothing to them as well as music that is energizing to them 4. Patients will discuss how to use this knowledge to assist in maintaining wellness and recovery 5. Patients will explore the use of music as a coping skill  Summary of Patient Progress:  At the beginning of group, patient expressed that she felt "okay" and at the end of group said the same.  In the meantime, she did not react to any music and when asked how it made her feel, did not respond.  Therapeutic Modalities: Solution Focused Brief Therapy Activity   Ambrose MantleMareida Grossman-Orr, LCSW

## 2017-10-13 NOTE — [facility] Group Notes (Signed)
[facility] Group Notes:  (Nursing/MHT/Case Management/Adjunct)  Date:  10/13/2017  Time:  9:54 AM  Type of Therapy:  Nurse Education  Participation Level:  Minimal  Participation Quality:  Appropriate and Attentive  Affect:  Anxious  Cognitive:  Appropriate  Insight:  Improving  Engagement in Group:  Improving  Modes of Intervention:  Activity  Summary of Progress/Problems: This group consisted of learning about healthy support systems. We discussed about different community support resources, including the ACTT team, a therapist, a friend, a family member, etc. We also covered healthy communication skills. Her goal today is "to get out of here." She participated minimally and appeared anxious.  Kirstie MirzaJonathan C Kayshaun Polanco 10/13/2017, 9:54 AM

## 2017-10-13 NOTE — [facility] Group Notes (Signed)
[facility] Group Notes:  (Nursing/MHT/Case Management/Adjunct)  Date:  10/13/2017  Time:  5:32 PM  Type of Therapy:  Nurse Education  Participation Level:  Active  Participation Quality:  Appropriate and Attentive  Affect:  Anxious  Cognitive:  Alert and Appropriate  Insight:  Appropriate  Engagement in Group:  Improving  Modes of Intervention:  Discussion and Education  Summary of Progress/Problems: In this group, we discussed communication "Do's and Don'ts." We discussed appropriate body language, tone of voice, and word choices. Also, we had the patients take a love language test, and discussed the results. The patient had some participation in the group therapy session. She appeared anxious, but did share.  Kirstie MirzaJonathan C Palma Buster 10/13/2017, 5:32 PM

## 2017-10-13 NOTE — Plan of Care (Signed)
Problem: Safety: Goal: Periods of time without injury will increase Outcome: Progressing   Problem: Medication: Goal: Compliance with prescribed medication regimen will improve Outcome: Progressing D: Pt A & O X3. Denies SI, HI, AVH and pain ("no, I don't want to die"). Presents tearful, thought blocking and paranoid on interactions. 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 Pt now believes that her 30 y/o son has Pneumonia after reading the pneumonia vaccine handout "I watched him and he was fatigued and dizzy, I want to take him to the hospital myself, I shouldn't have called 911 in the first place". A: Emotional support and encouragement provided to pt.  All medications given per MD's order and effects monitored. Safety checks maintained. R: Pt has been medication compliant though apprehensive / cautious interactions. POC maintained without self harm gestures.

## 2017-10-14 DIAGNOSIS — F250 Schizoaffective disorder, bipolar type: Principal | ICD-10-CM

## 2017-10-14 DIAGNOSIS — F4310 Post-traumatic stress disorder, unspecified: Secondary | ICD-10-CM

## 2017-10-14 LAB — HEMOGLOBIN A1C
Hgb A1c MFr Bld: 5.2 % (ref 4.8–5.6)
MEAN PLASMA GLUCOSE: 103 mg/dL

## 2017-10-14 LAB — HIV ANTIBODY (ROUTINE TESTING W REFLEX): HIV SCREEN 4TH GENERATION: NONREACTIVE

## 2017-10-14 MED ORDER — PALIPERIDONE ER 6 MG PO TB24
6.0000 mg | ORAL_TABLET | Freq: Every day | ORAL | Status: DC
  Administered 2017-10-14 – 2017-10-15 (×2): 6 mg via ORAL
  Filled 2017-10-14 (×4): qty 1

## 2017-10-14 MED ORDER — ESCITALOPRAM OXALATE 10 MG PO TABS
10.0000 mg | ORAL_TABLET | Freq: Every day | ORAL | Status: DC
  Administered 2017-10-15 – 2017-10-21 (×7): 10 mg via ORAL
  Filled 2017-10-14: qty 7
  Filled 2017-10-14 (×8): qty 1

## 2017-10-14 NOTE — Progress Notes (Signed)
Pt woke up at this time; came up to NS stating "I am not taking any more medications". Pt was encourage to speak with provider tomorrow. Pt went back to room and lay back down.

## 2017-10-14 NOTE — Progress Notes (Signed)
Adult Psychoeducational Group Note  Date:  10/14/2017 Time:  9:21 PM  Group Topic/Focus:  Wrap-Up Group:   The focus of this group is to help patients review their daily goal of treatment and discuss progress on daily workbooks.  Participation Level:  Active  Participation Quality:  Appropriate  Affect:  Appropriate  Cognitive:  Appropriate  Insight: Appropriate  Engagement in Group:  Engaged  Modes of Intervention:  Discussion  Additional Comments:  The patient rates today a 6.The patient also attended groups and is preparing for discharge.  Octavio Mannshigpen, Marinda Tyer Lee 10/14/2017, 9:21 PM

## 2017-10-14 NOTE — Progress Notes (Signed)
Pt was observed eating a snack in her room. Pt attended wrap-up group. Pt appears paranoid/suspicious/fearful/anxious in affect and mood. Pt was cautious/minimal with interaction. Pt states her day was good. PRNs offered; Pt declined. Support and encorgament provided. Will continue with POC.

## 2017-10-14 NOTE — Plan of Care (Signed)
  Problem: Education: Goal: Emotional status will improve Outcome: Not Progressing Goal: Mental status will improve Outcome: Not Progressing   Problem: Education: Goal: Mental status will improve Outcome: Not Progressing   Problem: Activity: Goal: Interest or engagement in activities will improve Outcome: Not Progressing   Problem: Safety: Goal: Periods of time without injury will increase Outcome: Progressing   Problem: Medication: Goal: Compliance with prescribed medication regimen will improve Outcome: Progressing  DAR NOTE: Patient presents with anxious affect and paranoid mood.  Denies suicidal thoughts, pain, auditory and visual hallucinations.  Described energy level as hyper and concentration as good.  Rates depression at 2, hopelessness at 2, and anxiety at 2.  Verbalizes concern to return home to her son.  Maintained on routine safety checks.  Medications given as prescribed.  Support and encouragement offered as needed.  States goal for today is "getting fully recovered so I can return home."  Patient observed pacing the hallway and looking towards the exit door.   Patient is safe on and off the unit.

## 2017-10-14 NOTE — [facility] Group Notes (Signed)
LCSW Group Therapy Notes 10/14/2017 1:15pm Type of Therapy and Topic:  Group Therapy:  Communication Participation Level:  Minimal  Description of Group: Patients will identify how individuals communicate with one another appropriately and inappropriately.  Patients will be guided to discuss their thoughts, feelings and behaviors related to barriers when communicating.  The group will process together ways to execute positive and appropriate communication with attention given to how one uses behavior, tone and body language.  Patients will be encouraged to reflect on a situation where they were successfully able to communicate and what made this example successful.  Group will identify specific changes they are motivated to make in order to overcome communication barriers with self, peers, authority, and parents.  This group will be process-oriented with patients participating in exploration of their own experiences, giving and receiving support, and challenging self and other group members.   Therapeutic Goals 1. Patient will identify how people communicate (body language, facial expression, and electronics).  Group will also discuss tone, voice and how these impact what is communicated and what is received. 2. Patient will identify feelings (such as fear or worry), thought process and behaviors related to why people internalize feelings rather than express self openly. 3. Patient will identify two changes they are willing to make to overcome communication barriers 4. Members will then practice through role play how to communicate using I statements, I feel statements, and acknowledging feelings rather than displacing feelings on others Summary of Patient Progress:  Stayed the entire time, engaged throughout.  Minimal participation, but willing to respond when questioned directly.  "I'm introverted.  That is why I am so quiet."  Others appreciated that she let them know why she holds  back.   Therapeutic Modalities Cognitive Behavioral Therapy Motivational Interviewing Solution Focused Therapy  Monica RogueRodney B Benay Pomeroy, [place]CS[place] 10/14/2017 4:16 PM

## 2017-10-14 NOTE — Plan of Care (Signed)
  Problem: Activity: Goal: Interest or engagement in activities will improve Outcome: Progressing-Monica Byrd attended group this evening.     Problem: Activity: Goal: Sleeping patterns will improve Outcome: Progressing-she declined any prn medication for sleep and is currently resting with her eyes closed.

## 2017-10-14 NOTE — Tx Team (Signed)
Interdisciplinary Treatment and Diagnostic Plan Update  10/14/2017 Time of Session: 8:17 AM  Monica Byrd MRN: [id number]  Principal Diagnosis: Schizoaffective disorder [facility])  Secondary Diagnoses: Principal Problem:   Schizoaffective disorder ([place])   Current Medications:  Current Facility-Administered Medications  Medication Dose Route Frequency Provider Last Rate Last Dose  . acetaminophen (TYLENOL) tablet 650 mg  650 mg Oral Q6H PRN Money, Lowry Ram, FNP      . alum & mag hydroxide-simeth (MAALOX/MYLANTA) 200-200-20 MG/5ML suspension 30 mL  30 mL Oral Q4H PRN Money, Darnelle Maffucci B, FNP      . diphenhydrAMINE (BENADRYL) capsule 25 mg  25 mg Oral Q6H PRN Money, Lowry Ram, FNP       Or  . diphenhydrAMINE (BENADRYL) injection 25 mg  25 mg Intramuscular Q6H PRN Money, Lowry Ram, FNP   25 mg at 10/11/17 1855  . escitalopram (LEXAPRO) tablet 5 mg  5 mg Oral Daily Cobos, Fernando A, MD   5 mg at 10/13/17 0802  . haloperidol (HALDOL) tablet 5 mg  5 mg Oral Q6H PRN Money, Lowry Ram, FNP       Or  . haloperidol lactate (HALDOL) injection 5 mg  5 mg Intramuscular Q6H PRN Money, Lowry Ram, FNP   5 mg at 10/11/17 1854  . hydrOXYzine (ATARAX/VISTARIL) tablet 25 mg  25 mg Oral TID PRN Money, Lowry Ram, FNP   25 mg at 10/12/17 0901  . LORazepam (ATIVAN) tablet 1 mg  1 mg Oral Q6H PRN Money, Lowry Ram, FNP       Or  . LORazepam (ATIVAN) injection 1 mg  1 mg Intramuscular Q6H PRN Money, Lowry Ram, FNP   1 mg at 10/11/17 1853  . magnesium hydroxide (MILK OF MAGNESIA) suspension 30 mL  30 mL Oral Daily PRN Money, Lowry Ram, FNP      . OLANZapine zydis (ZYPREXA) disintegrating tablet 5 mg  5 mg Oral BID Nanci Pina, FNP   5 mg at 10/13/17 1713  . potassium chloride SA (K-DUR,KLOR-CON) CR tablet 20 mEq  20 mEq Oral BID Nanci Pina, FNP   20 mEq at 10/13/17 1714  . traZODone (DESYREL) tablet 50 mg  50 mg Oral QHS PRN Money, Lowry Ram, FNP        PTA Medications: Medications Prior to Admission   Medication Sig Dispense Refill Last Dose  . acetaminophen (TYLENOL) 500 MG tablet Take 500 mg by mouth every 6 (six) hours as needed for moderate pain.   Past Week at Unknown time  . busPIRone (BUSPAR) 5 MG tablet Take 1 tablet by mouth 3 (three) times daily.  2 Past Week at Unknown time  . escitalopram (LEXAPRO) 10 MG tablet Take 1 tablet by mouth daily.  5 Past Week at Unknown time  . metroNIDAZOLE (FLAGYL) 500 MG tablet Take 1 tablet (500 mg total) by mouth 2 (two) times daily. (Patient not taking: Reported on 10/10/2017) 14 tablet 0 Completed Course at Unknown time  . Multiple Vitamin (MULTIVITAMIN WITH MINERALS) TABS tablet Take 1 tablet by mouth daily.   Past Week at Unknown time  . ondansetron (ZOFRAN) 4 MG tablet Take 1 tablet (4 mg total) by mouth every 6 (six) hours. (Patient not taking: Reported on 10/10/2017) 12 tablet 0 Completed Course at Unknown time    Patient Stressors: Medication change or noncompliance Other: TBD  Patient Strengths: Motivation for treatment/growth Other: TBD  Treatment Modalities: Medication Management, Group therapy, Case management,  1 to 1 session with clinician, Psychoeducation,  Recreational therapy.   Physician Treatment Plan for Primary Diagnosis: Schizoaffective disorder ([place]) Long Term Goal(s): Improvement in symptoms so as ready for discharge  Short Term Goals: Ability to identify changes in lifestyle to reduce recurrence of condition will improve Ability to verbalize feelings will improve Ability to disclose and discuss suicidal ideas Ability to demonstrate self-control will improve Ability to identify and develop effective coping behaviors will improve Ability to maintain clinical measurements within normal limits will improve Compliance with prescribed medications will improve  Medication Management: Evaluate patient's response, side effects, and tolerance of medication regimen.  Therapeutic Interventions: 1 to 1 sessions, Unit Group  sessions and Medication administration.  Evaluation of Outcomes: Progressing  Physician Treatment Plan for Secondary Diagnosis: Principal Problem:   Schizoaffective disorder ([place])   Long Term Goal(s): Improvement in symptoms so as ready for discharge  Short Term Goals: Ability to identify changes in lifestyle to reduce recurrence of condition will improve Ability to verbalize feelings will improve Ability to disclose and discuss suicidal ideas Ability to demonstrate self-control will improve Ability to identify and develop effective coping behaviors will improve Ability to maintain clinical measurements within normal limits will improve Compliance with prescribed medications will improve  Medication Management: Evaluate patient's response, side effects, and tolerance of medication regimen.  Therapeutic Interventions: 1 to 1 sessions, Unit Group sessions and Medication administration.  Evaluation of Outcomes: Progressing   RN Treatment Plan for Primary Diagnosis: Schizoaffective disorder ([place]) Long Term Goal(s): Knowledge of disease and therapeutic regimen to maintain health will improve  Short Term Goals: Ability to identify and develop effective coping behaviors will improve and Compliance with prescribed medications will improve  Medication Management: RN will administer medications as ordered by provider, will assess and evaluate patient's response and provide education to patient for prescribed medication. RN will report any adverse and/or side effects to prescribing provider.  Therapeutic Interventions: 1 on 1 counseling sessions, Psychoeducation, Medication administration, Evaluate responses to treatment, Monitor vital signs and CBGs as ordered, Perform/monitor CIWA, COWS, AIMS and Fall Risk screenings as ordered, Perform wound care treatments as ordered.  Evaluation of Outcomes: Progressing   LCSW Treatment Plan for Primary Diagnosis: Schizoaffective disorder ([place]) Long  Term Goal(s): Safe transition to appropriate next level of care at discharge, Engage patient in therapeutic group addressing interpersonal concerns.  Short Term Goals: Engage patient in aftercare planning with referrals and resources  Therapeutic Interventions: Assess for all discharge needs, 1 to 1 time with Social worker, Explore available resources and support systems, Assess for adequacy in community support network, Educate family and significant other(s) on suicide prevention, Complete Psychosocial Assessment, Interpersonal group therapy.  Evaluation of Outcomes: Met  Return home, follow up outpt   Progress in Treatment: Attending groups: Yes Participating in groups: Yes Taking medication as prescribed: Yes Toleration medication: Yes, no side effects reported at this time Family/Significant other contact made: No Patient understands diagnosis: No Limited insight Discussing patient identified problems/goals with staff: Yes Medical problems stabilized or resolved: Yes Denies suicidal/homicidal ideation: Yes Issues/concerns per patient self-inventory: None Other: N/A  New problem(s) identified: None identified at this time.   New Short Term/Long Term Goal(s): "I just want to get better so I can get back to my family.There's a lot of noise going on around me. My OCD is taking over.  It feels like germs are everywhere."   Discharge Plan or Barriers:   Reason for Continuation of Hospitalization: Disorganization Depression Hallucinations  Medication stabilization   Estimated Length of Stay: 8/16  Attendees: Patient: Monica Byrd 10/14/2017  8:17 AM  Physician: Maris Berger, MD 10/14/2017  8:17 AM  Nursing: Sena Hitch, RN 10/14/2017  8:17 AM  RN Care Manager: Lars Pinks, RN 10/14/2017  8:17 AM  Social Worker: Ripley Fraise 10/14/2017  8:17 AM  Recreational Therapist: Winfield Cunas 10/14/2017  8:17 AM  Other: Norberto Sorenson 10/14/2017  8:17 AM  Other:   10/14/2017  8:17 AM    Scribe for Treatment Team:  Roque Lias LCSW 10/14/2017 8:17 AM

## 2017-10-14 NOTE — Progress Notes (Signed)
D:  Monica Byrd was in her room at beginning of shift and was visiting with her mother.  RN assisted with answering questions about the unit and rules.  Britini was very hyperfocused on the paperwork for the pneumonia vaccination and  RN had to reassure her that we do not given the medication unless she consents.  She denied SI/HI or A/V hallucinations.  She was guarded in her interaction but was pleasant.  She denied any pain or discomfort and appeared to be in no physical distress.   She attended evening wrap up group.  She declined prn medication for sleep and is currently in her room resting with her eyes closed.  She appears to be asleep. A:  1:1 with RN for support and encouragement.  Medications as ordered.  Q 15 minute checks maintained for safety.  Encouraged participation in group and unit activities.   R:  Keisi remains safe on the unit.  We will continue to monitor the progress towards her goals.

## 2017-10-14 NOTE — Progress Notes (Signed)
[facility] MD Progress Note  10/14/2017 2:58 PM Monica Byrd  MRN:  [id number] Subjective:    Monica Byrd is a 32 y/o F with history of schizoaffective bipolar type and PTSD who was admitted on IVC with worsening disorganization, paranoia, and intrusive thoughts. Pt was started on regimen of zyprexa.  As per intake SRA: 32 year old female, employed, has an 62 year old child, who is currently with the father. Patient was brought to ED via EMS . Originally EMS had been contacted for patient's son, but as per notes EMS deemed that child was OK, doing well, but that patient seemed confused , psychotic, internally preoccupied , reporting she felt she had been abducted. On admission to ED patient seemed fearful, confused and reported she felt that everybody could hear her, and that " something is coming out of my vagina that starts in my stomach". She denies alcohol or dug abuse- admission BAL negative, admission UDS positive for Cannabis Today patient presents as fair historian, appears depressed, vaguely anxious, guarded. States she felt " like everything was crumbling around me", but does not elaborate further. Endorses some sadness, anhedonia, poor sleep prior to admission. Today denies hallucinations. Patient endorses history of depression. As per chart patient has history of a prior psychiatric admission in May 2014 for depression, psychosis, at the time was diagnosed with Schizoaffective Disorder and PTSD, discharged on Prozac and Risperidone . She denies history of suicidal attempts . Prior to this admission she was prescribed Buspar and Lexapro- at this time it is unclear if she was compliant with medication regimen.  Today upon evaluation:  Pt shares regarding her reasons for admission, "I had a panic attack. I thought my son was hiding something from me and he wouldn't tell me what it was. He was laying there it and it felt like he was passing - like he was dying. I started to panic because I  didn't know if he had gotten some bacteria on him." Pt relates having anxiety regarding recurrent intrusive thought that she, her son, and her apartment have been contaminated. She shares that her partner works in nursing homes, and she is fearful that he brings home bacteria from those environments. She denies any compulsive behaviors prior to hospitalization. She denies SI/HI/AH/VH. She reports that she is sleeping well. Her appetite is good. She is tolerating her medications well. We discussed changing zyprexa to [facility] with plan to transition to long-acting injectable form if she has good tolerability/efficacy, and pt was in agreement. She had no further questions, comments, or concerns.  Principal Problem: Schizoaffective disorder, bipolar type ([place]) Diagnosis:   Patient Active Problem List   Diagnosis Date Noted  . Posttraumatic stress disorder [F43.10] 07/28/2012  . Schizoaffective disorder, bipolar type ([place]) [F25.0] 07/28/2012   Total Time spent with patient: 30 minutes  Past Psychiatric History: see H&P  Past Medical History:  Past Medical History:  Diagnosis Date  . Anxiety   . Depression   . Hypertension    History reviewed. No pertinent surgical history. Family History: History reviewed. No pertinent family history. Family Psychiatric  History: see H&P Social History:  Social History   Substance and Sexual Activity  Alcohol Use Yes   Comment: 1-2 drinks occasionally      Social History   Substance and Sexual Activity  Drug Use Yes  . Types: Marijuana   Comment: pt currently denies as of 10/10/17    Social History   Socioeconomic History  . Marital status: Single  Spouse name: Not on file  . Number of children: Not on file  . Years of education: Not on file  . Highest education level: Not on file  Occupational History  . Not on file  Social Needs  . Financial resource strain: Not on file  . Food insecurity:    Worry: Not on file    Inability: Not on file   . Transportation needs:    Medical: Not on file    Non-medical: Not on file  Tobacco Use  . Smoking status: Never Smoker  . Smokeless tobacco: Never Used  Substance and Sexual Activity  . Alcohol use: Yes    Comment: 1-2 drinks occasionally   . Drug use: Yes    Types: Marijuana    Comment: pt currently denies as of 10/10/17  . Sexual activity: Not on file  Lifestyle  . Physical activity:    Days per week: Not on file    Minutes per session: Not on file  . Stress: Not on file  Relationships  . Social connections:    Talks on phone: Not on file    Gets together: Not on file    Attends religious service: Not on file    Active member of club or organization: Not on file    Attends meetings of clubs or organizations: Not on file    Relationship status: Not on file  Other Topics Concern  . Not on file  Social History Narrative  . Not on file   Additional Social History:                         Sleep: Good  Appetite:  Good  Current Medications: Current Facility-Administered Medications  Medication Dose Route Frequency Provider Last Rate Last Dose  . acetaminophen (TYLENOL) tablet 650 mg  650 mg Oral Q6H PRN Money, Lowry Ram, FNP      . alum & mag hydroxide-simeth (MAALOX/MYLANTA) 200-200-20 MG/5ML suspension 30 mL  30 mL Oral Q4H PRN Money, Darnelle Maffucci B, FNP      . diphenhydrAMINE (BENADRYL) capsule 25 mg  25 mg Oral Q6H PRN Money, Lowry Ram, FNP       Or  . diphenhydrAMINE (BENADRYL) injection 25 mg  25 mg Intramuscular Q6H PRN Money, Lowry Ram, FNP   25 mg at 10/11/17 1855  . [START ON 10/15/2017] escitalopram (LEXAPRO) tablet 10 mg  10 mg Oral Daily Niyam Bisping T, MD      . haloperidol (HALDOL) tablet 5 mg  5 mg Oral Q6H PRN Money, Lowry Ram, FNP       Or  . haloperidol lactate (HALDOL) injection 5 mg  5 mg Intramuscular Q6H PRN Money, Lowry Ram, FNP   5 mg at 10/11/17 1854  . hydrOXYzine (ATARAX/VISTARIL) tablet 25 mg  25 mg Oral TID PRN Money, Lowry Ram, FNP    25 mg at 10/12/17 0901  . LORazepam (ATIVAN) tablet 1 mg  1 mg Oral Q6H PRN Money, Lowry Ram, FNP       Or  . LORazepam (ATIVAN) injection 1 mg  1 mg Intramuscular Q6H PRN Money, Lowry Ram, FNP   1 mg at 10/11/17 1853  . magnesium hydroxide (MILK OF MAGNESIA) suspension 30 mL  30 mL Oral Daily PRN Money, Darnelle Maffucci B, FNP      . paliperidone (INVEGA) 24 hr tablet 6 mg  6 mg Oral Daily Pennelope Bracken, MD   6 mg at 10/14/17 1202  . potassium  chloride SA (K-DUR,KLOR-CON) CR tablet 20 mEq  20 mEq Oral BID Nanci Pina, FNP   20 mEq at 10/14/17 9767  . traZODone (DESYREL) tablet 50 mg  50 mg Oral QHS PRN Money, Lowry Ram, FNP        Lab Results:  Results for orders placed or performed during the hospital encounter of 10/11/17 (from the past 48 hour(s))  Comprehensive metabolic panel     Status: Abnormal   Collection Time: 10/13/17  6:18 AM  Result Value Ref Range   Sodium 143 135 - 145 mmol/L   Potassium 3.3 (L) 3.5 - 5.1 mmol/L   Chloride 107 98 - 111 mmol/L   CO2 26 22 - 32 mmol/L   Glucose, Bld 98 70 - 99 mg/dL   BUN 25 (H) 6 - 20 mg/dL   Creatinine, Ser 1.17 (H) 0.44 - 1.00 mg/dL   Calcium 9.5 8.9 - 10.3 mg/dL   Total Protein 7.9 6.5 - 8.1 g/dL   Albumin 4.1 3.5 - 5.0 g/dL   AST 54 (H) 15 - 41 U/L   ALT 29 0 - 44 U/L   Alkaline Phosphatase 61 38 - 126 U/L   Total Bilirubin 0.8 0.3 - 1.2 mg/dL   GFR calc non Af Amer >60 >60 mL/min   GFR calc Af Amer >60 >60 mL/min    Comment: (NOTE) The eGFR has been calculated using the CKD EPI equation. This calculation has not been validated in all clinical situations. eGFR's persistently <60 mL/min signify possible Chronic Kidney Disease.    Anion gap 10 5 - 15    Comment: Performed at [facility], [place] [address] [place], [place] [address]  CBC with Differential/Platelet     Status: None   Collection Time: 10/13/17  6:18 AM  Result Value Ref Range   WBC 7.9 4.0 - 10.5 K/uL   RBC 4.09 3.87 - 5.11 MIL/uL    Hemoglobin 13.1 12.0 - 15.0 g/dL   HCT 37.6 36.0 - 46.0 %   MCV 91.9 78.0 - 100.0 fL   MCH 32.0 26.0 - 34.0 pg   MCHC 34.8 30.0 - 36.0 g/dL   RDW 12.5 11.5 - 15.5 %   Platelets 296 150 - 400 K/uL   Neutrophils Relative % 59 %   Neutro Abs 4.7 1.7 - 7.7 K/uL   Lymphocytes Relative 33 %   Lymphs Abs 2.6 0.7 - 4.0 K/uL   Monocytes Relative 7 %   Monocytes Absolute 0.5 0.1 - 1.0 K/uL   Eosinophils Relative 1 %   Eosinophils Absolute 0.1 0.0 - 0.7 K/uL   Basophils Relative 0 %   Basophils Absolute 0.0 0.0 - 0.1 K/uL    Comment: Performed at [facility], [place] [address], [place], [place] [address]  TSH     Status: None   Collection Time: 10/13/17  6:18 AM  Result Value Ref Range   TSH 4.399 0.350 - 4.500 uIU/mL    Comment: Performed by a 3rd Generation assay with a functional sensitivity of <=0.01 uIU/mL. Performed at [facility], [place] [address] [place], [place] [address]   Hemoglobin A1c     Status: None   Collection Time: 10/13/17  6:18 AM  Result Value Ref Range   Hgb A1c MFr Bld 5.2 4.8 - 5.6 %    Comment: (NOTE)         Prediabetes: 5.7 - 6.4         Diabetes: >6.4  Glycemic control for adults with diabetes: <7.0    Mean Plasma Glucose 103 mg/dL    Comment: (NOTE) Performed At: [facility] [place], [place] [id number] Rush Farmer MD [id number]   Lipid panel     Status: Abnormal   Collection Time: 10/13/17  6:18 AM  Result Value Ref Range   Cholesterol 168 0 - 200 mg/dL   Triglycerides 58 <150 mg/dL   HDL 48 >40 mg/dL   Total CHOL/HDL Ratio 3.5 RATIO   VLDL 12 0 - 40 mg/dL   LDL Cholesterol 108 (H) 0 - 99 mg/dL    Comment:        Total Cholesterol/HDL:CHD Risk Coronary Heart Disease Risk Table                     Men   Women  1/2 Average Risk   3.4   3.3  Average Risk       5.0   4.4  2 X Average Risk   9.6   7.1  3 X Average Risk  23.4   11.0        Use the calculated Patient  Ratio above and the CHD Risk Table to determine the patient's CHD Risk.        ATP III CLASSIFICATION (LDL):  <100     mg/dL   Optimal  100-129  mg/dL   Near or Above                    Optimal  130-159  mg/dL   Borderline  160-189  mg/dL   High  >190     mg/dL   Very High Performed at [place] [address] [place], [place] [address]     Blood Alcohol level:  Lab Results  Component Value Date   ETH <10 10/10/2017   ETH (H) 07/02/2008    108        LOWEST DETECTABLE LIMIT FOR SERUM ALCOHOL IS 5 mg/dL FOR MEDICAL PURPOSES ONLY    Metabolic Disorder Labs: Lab Results  Component Value Date   HGBA1C 5.2 10/13/2017   MPG 103 10/13/2017   No results found for: PROLACTIN Lab Results  Component Value Date   CHOL 168 10/13/2017   TRIG 58 10/13/2017   HDL 48 10/13/2017   CHOLHDL 3.5 10/13/2017   VLDL 12 10/13/2017   LDLCALC 108 (H) 10/13/2017    Physical Findings: AIMS: Facial and Oral Movements Muscles of Facial Expression: None, normal Lips and Perioral Area: None, normal Jaw: None, normal Tongue: None, normal,Extremity Movements Upper (arms, wrists, hands, fingers): None, normal Lower (legs, knees, ankles, toes): None, normal, Trunk Movements Neck, shoulders, hips: None, normal, Overall Severity Severity of abnormal movements (highest score from questions above): None, normal Incapacitation due to abnormal movements: None, normal Patient's awareness of abnormal movements (rate only patient's report): No Awareness, Dental Status Current problems with teeth and/or dentures?: No Does patient usually wear dentures?: No  CIWA:    COWS:     Musculoskeletal: Strength & Muscle Tone: within normal limits Gait & Station: normal Patient leans: N/A  Psychiatric Specialty Exam: Physical Exam  Nursing note and vitals reviewed.   Review of Systems  Constitutional: Negative for chills and fever.  Respiratory: Negative for cough and shortness  of breath.   Cardiovascular: Negative for chest pain.  Gastrointestinal: Negative for abdominal pain, heartburn, nausea and vomiting.  Psychiatric/Behavioral: Negative for depression, hallucinations and suicidal ideas. The patient  is nervous/anxious. The patient does not have insomnia.     Blood pressure 110/73, pulse (!) 126, temperature 98.9 F (37.2 C), resp. rate 16, SpO2 99 %.There is no height or weight on file to calculate BMI.  General Appearance: Casual  Eye Contact:  Good  Speech:  Clear and Coherent and Normal Rate  Volume:  Normal  Mood:  Anxious  Affect:  Appropriate and Congruent  Thought Process:  Coherent and Goal Directed  Orientation:  Full (Time, Place, and Person)  Thought Content:  Logical  Suicidal Thoughts:  No  Homicidal Thoughts:  No  Memory:  Immediate;   Fair Recent;   Fair Remote;   Fair  Judgement:  Poor  Insight:  Fair  Psychomotor Activity:  Normal  Concentration:  Concentration: Fair  Recall:  [employer] of Knowledge:  Fair  Language:  Fair  Akathisia:  No  Handed:    AIMS (if indicated):     Assets:  Resilience  ADL's:  Intact  Cognition:  WNL  Sleep:  Number of Hours: 5.5   Treatment Plan Summary: Daily contact with patient to assess and evaluate symptoms and progress in treatment and Medication management   -Continue inpatient hospitalization  -Schizoaffective disorder, bipolar type and PTSD   -DC zyprexa   -Change lexapro 62m po qDay to lexapro 193mpo qday   -Start Invega 25m625mo qday (for 1 dose to test for tolerability)   -Plan to start [id number] once on 8/13 followed by [id number] days starting on 8/16  -Anxiety    -Continue vistaril 25mg 61m8h prn anxiety  -Agitation     -Continue haldol 5mg po63m q6h prn agitation   -Continue ativan 1mg po/42mq6h prn agitation    -Continue benadryl 25mg po/78m6h prn agitation  -Insomnia   -Continue trazodone 50mg po q35mrn insomnia  -hypokalemia    -Continue potassium chloride CR 20 mEq po BID  -Encourage participation in groups and therapeutic milieu  -Disposition planning will be ongoing  ChristophePennelope Bracken2019, 2:58 PM

## 2017-10-15 LAB — RPR: RPR Ser Ql: NONREACTIVE

## 2017-10-15 MED ORDER — PALIPERIDONE PALMITATE ER 234 MG/1.5ML IM SUSY
234.0000 mg | PREFILLED_SYRINGE | Freq: Once | INTRAMUSCULAR | Status: AC
  Administered 2017-10-15: 234 mg via INTRAMUSCULAR
  Filled 2017-10-15: qty 1.5

## 2017-10-15 MED ORDER — PALIPERIDONE PALMITATE ER 156 MG/ML IM SUSY
156.0000 mg | PREFILLED_SYRINGE | INTRAMUSCULAR | Status: DC
  Administered 2017-10-18: 156 mg via INTRAMUSCULAR
  Filled 2017-10-15: qty 1

## 2017-10-15 NOTE — Progress Notes (Signed)
Pt was observed eating a snack in her room. Pt attended wrap-up group. Pt appears paranoid/suspicious/fearful/anxious in affect and mood. Pt was cautious/minimal with interaction. Pt was noted to be increasingly paranoid this evening with staff. PRNs offered; Pt declined. Support and encorgament provided. Writer left water bottles brought from home for Pt by beside. Encourage Pt to push fluids.Will continue with POC.

## 2017-10-15 NOTE — Plan of Care (Signed)
  Problem: Physical Regulation: Goal: Ability to maintain clinical measurements within normal limits will improve Outcome: Progressing   Problem: Safety: Goal: Periods of time without injury will increase Outcome: Progressing   Problem: Medication: Goal: Compliance with prescribed medication regimen will improve Outcome: Progressing  DAR NOTE: Patient presents with anxious affect and depressed mood.  Patient remained delusional and paranoid about being in the hospital.  Believed provider and staff not telling her about her true diagnoses.  Denies suicidal thoughts, pain, auditory and visual hallucinations.  Described energy level as normal and concentration as good.  Rates depression at 0, hopelessness at 0, and anxiety at 2.  Maintained on routine safety checks.  Medications given as prescribed.  Support and encouragement offered as needed.  Attended group and participated.  States goal for today is "work on my mindfulness."  Patient visible in milieu briefly.  Received Invega injection.  No adverse reaction noted.  Patient is safe on and off the unit.

## 2017-10-15 NOTE — Progress Notes (Signed)
Adult Psychoeducational Group Note  Date:  10/15/2017 Time:  9:03 PM  Group Topic/Focus:  Wrap-Up Group:   The focus of this group is to help patients review their daily goal of treatment and discuss progress on daily workbooks.  Participation Level:  Active  Participation Quality:  Appropriate  Affect:  Appropriate  Cognitive:  Appropriate  Insight: Appropriate  Engagement in Group:  Engaged  Modes of Intervention:  Discussion  Additional Comments:  The patient expressed that she rates today a 7.The patient also she attended group.  Octavio Mannshigpen, Thersa Mohiuddin Lee 10/15/2017, 9:03 PM

## 2017-10-15 NOTE — [facility] Group Notes (Signed)
LCSW Group Therapy Note   10/15/2017 1:15pm   Type of Therapy and Topic:  Group Therapy:  Overcoming Obstacles   Participation Level:  Minimal   Description of Group:    In this group patients will be encouraged to explore what they see as obstacles to their own wellness and recovery. They will be guided to discuss their thoughts, feelings, and behaviors related to these obstacles. The group will process together ways to cope with barriers, with attention given to specific choices patients can make. Each patient will be challenged to identify changes they are motivated to make in order to overcome their obstacles. This group will be process-oriented, with patients participating in exploration of their own experiences as well as giving and receiving support and challenge from other group members.   Therapeutic Goals: 1. Patient will identify personal and current obstacles as they relate to admission. 2. Patient will identify barriers that currently interfere with their wellness or overcoming obstacles.  3. Patient will identify feelings, thought process and behaviors related to these barriers. 4. Patient will identify two changes they are willing to make to overcome these obstacles:      Summary of Patient Progress  Stayed the entire time, appeared engaged. Minimal interaction.  Admitted that she has not been talking to others about needing help.  Brought this up because she states she is invested in not returning to the hospital, and this is what she came up with so far.  But also expressed reservations with sharing and feeling safe in doing so. Appears guarded, somewhat confused.    Therapeutic Modalities:   Cognitive Behavioral Therapy Solution Focused Therapy Motivational Interviewing Relapse Prevention Therapy  Ida RogueRodney B Taleah Bellantoni, LCSW 10/15/2017 1:28 PM

## 2017-10-15 NOTE — Progress Notes (Signed)
[facility]  10/15/2017 3:09 PM Monica Byrd  MRN:  [id number]  Subjective: Monica Byrd reports, "I have been in this hospital since Friday. I knew there was something going with my son. He was lying down looking like he was dying. I called the EMS, they came & took me to the hospital instead. I'm noticing some things going on here & no one is telling me anything. I just need to about my blood work for the STD test. My mood is kind of not good. I'm worried that my son & his dad a planning a trick on me. I feel it".   Monica Byrd is a 32 y/o F with history of schizoaffective bipolar type and PTSD who was admitted on IVC with worsening disorganization, paranoia, and intrusive thoughts. Pt was started on regimen of zyprexa.  As per intake SRA: [age] year old female, employed, has an 30 year old child, who is currently with the father. Patient was brought to ED via EMS . Originally EMS had been contacted for patient's son, but as per notes EMS deemed that child was OK, doing well, but that patient seemed confused , psychotic, internally preoccupied , reporting she felt she had been abducted. On admission to ED patient seemed fearful, confused and reported she felt that everybody could hear her, and that " something is coming out of my vagina that starts in my stomach". She denies alcohol or dug abuse- admission BAL negative, admission UDS positive for Cannabis Today patient presents as fair historian, appears depressed, vaguely anxious, guarded. States she felt " like everything was crumbling around me", but does not elaborate further. Endorses some sadness, anhedonia, poor sleep prior to admission. Today denies hallucinations. Patient endorses history of depression. As per chart patient has history of a prior psychiatric admission in May 2014 for depression, psychosis, at the time was diagnosed with Schizoaffective Disorder and PTSD, discharged on Prozac and Risperidone . She denies history of  suicidal attempts . Prior to this admission she was prescribed Buspar and Lexapro- at this time it is unclear if she was compliant with medication regimen.  Today upon evaluation: Monica Byrd remains delusional as well as paranois. No changes from previous evaluation such below. Pt shares regarding her reasons for admission, "I had a panic attack. I thought my son was hiding something from me and he wouldn't tell me what it was. He was laying there it and it felt like he was passing - like he was dying. I started to panic because I didn't know if he had gotten some bacteria on him." Pt relates having anxiety regarding recurrent intrusive thought that she, her son, and her apartment have been contaminated. She shares that her partner works in nursing homes, and she is fearful that he brings home bacteria from those environments. She denies any compulsive behaviors prior to hospitalization. She denies SI/HI/AH/VH. She reports that she is sleeping well. Her appetite is good. She is tolerating her medications well. The attending psychiatrist discussed with patient about [facility] with plan to transition to long-acting injectable form if she has good tolerability/efficacy & pt was in agreement. She had no further questions, comments, or concerns.  Principal Problem: Schizoaffective disorder, bipolar type (HCC)  Diagnosis:   Patient Active Problem List   Diagnosis Date Noted  . Posttraumatic stress disorder [F43.10] 07/28/2012  . Schizoaffective disorder, bipolar type (HCC) [F25.0] 07/28/2012   Total Time spent with patient: 15 minutes  Past Psychiatric History: See H&P  Past Medical History:  Past Medical History:  Diagnosis Date  . Anxiety   . Depression   . Hypertension    History reviewed. No pertinent surgical history.  Family History: History reviewed. No pertinent family history.  Family Psychiatric  History: See H&P  Social History:  Social History   Substance and  Sexual Activity  Alcohol Use Yes   Comment: 1-2 drinks occasionally      Social History   Substance and Sexual Activity  Drug Use Yes  . Types: Marijuana   Comment: pt currently denies as of 10/10/17    Social History   Socioeconomic History  . Marital status: Single    Spouse name: Not on file  . Number of children: Not on file  . Years of education: Not on file  . Highest education level: Not on file  Occupational History  . Not on file  Social Needs  . Financial resource strain: Not on file  . Food insecurity:    Worry: Not on file    Inability: Not on file  . Transportation needs:    Medical: Not on file    Non-medical: Not on file  Tobacco Use  . Smoking status: Never Smoker  . Smokeless tobacco: Never Used  Substance and Sexual Activity  . Alcohol use: Yes    Comment: 1-2 drinks occasionally   . Drug use: Yes    Types: Marijuana    Comment: pt currently denies as of 10/10/17  . Sexual activity: Not on file  Lifestyle  . Physical activity:    Days per week: Not on file    Minutes per session: Not on file  . Stress: Not on file  Relationships  . Social connections:    Talks on phone: Not on file    Gets together: Not on file    Attends religious service: Not on file    Active member of club or organization: Not on file    Attends meetings of clubs or organizations: Not on file    Relationship status: Not on file  Other Topics Concern  . Not on file  Social History Narrative  . Not on file   Additional Social History:   Sleep: Good  Appetite:  Good  Current Medications: Current Facility-Administered Medications  Medication Dose Route Frequency Provider Last Rate Last Dose  . acetaminophen (TYLENOL) tablet 650 mg  650 mg Oral Q6H PRN Money, Gerlene Burdockravis B, FNP      . alum & mag hydroxide-simeth (MAALOX/MYLANTA) 200-200-20 MG/5ML suspension 30 mL  30 mL Oral Q4H PRN Money, Feliz Beamravis B, FNP      . diphenhydrAMINE (BENADRYL) capsule 25 mg  25 mg Oral Q6H PRN  Money, Gerlene Burdockravis B, FNP       Or  . diphenhydrAMINE (BENADRYL) injection 25 mg  25 mg Intramuscular Q6H PRN Money, Gerlene Burdockravis B, FNP   25 mg at 10/11/17 1855  . escitalopram (LEXAPRO) tablet 10 mg  10 mg Oral Daily Jolyne Loaainville, Christopher T, MD   10 mg at 10/15/17 0805  . haloperidol (HALDOL) tablet 5 mg  5 mg Oral Q6H PRN Money, Gerlene Burdockravis B, FNP       Or  . haloperidol lactate (HALDOL) injection 5 mg  5 mg Intramuscular Q6H PRN Money, Gerlene Burdockravis B, FNP   5 mg at 10/11/17 1854  . hydrOXYzine (ATARAX/VISTARIL) tablet 25 mg  25 mg Oral TID PRN Money, Gerlene Burdockravis B, FNP   25 mg at 10/12/17 0901  . LORazepam (ATIVAN) tablet 1 mg  1 mg Oral Q6H PRN Money, Gerlene Burdock, FNP       Or  . LORazepam (ATIVAN) injection 1 mg  1 mg Intramuscular Q6H PRN Money, Gerlene Burdock, FNP   1 mg at 10/11/17 1853  . magnesium hydroxide (MILK OF MAGNESIA) suspension 30 mL  30 mL Oral Daily PRN Money, Gerlene Burdock, FNP      . [START ON 10/18/2017] paliperidone (INVEGA SUSTENNA) injection 156 mg  156 mg Intramuscular Q28 days Jolyne Loa T, MD      . potassium chloride SA (K-DUR,KLOR-CON) CR tablet 20 mEq  20 mEq Oral BID Truman Hayward, FNP   20 mEq at 10/15/17 0805  . traZODone (DESYREL) tablet 50 mg  50 mg Oral QHS PRN Money, Gerlene Burdock, FNP       Lab Results:  No results found for this or any previous visit (from the past 48 hour(s)).  Blood Alcohol level:  Lab Results  Component Value Date   ETH <10 10/10/2017   ETH (H) 07/02/2008    108        LOWEST DETECTABLE LIMIT FOR SERUM ALCOHOL IS 5 mg/dL FOR MEDICAL PURPOSES ONLY    Metabolic Disorder Labs: Lab Results  Component Value Date   HGBA1C 5.2 10/13/2017   MPG 103 10/13/2017   No results found for: PROLACTIN Lab Results  Component Value Date   CHOL 168 10/13/2017   TRIG 58 10/13/2017   HDL 48 10/13/2017   CHOLHDL 3.5 10/13/2017   VLDL 12 10/13/2017   LDLCALC 108 (H) 10/13/2017   Physical Findings: AIMS: Facial and Oral Movements Muscles of Facial Expression:  None, normal Lips and Perioral Area: None, normal Jaw: None, normal Tongue: None, normal,Extremity Movements Upper (arms, wrists, hands, fingers): None, normal Lower (legs, knees, ankles, toes): None, normal, Trunk Movements Neck, shoulders, hips: None, normal, Overall Severity Severity of abnormal movements (highest score from questions above): None, normal Incapacitation due to abnormal movements: None, normal Patient's awareness of abnormal movements (rate only patient's report): No Awareness, Dental Status Current problems with teeth and/or dentures?: No Does patient usually wear dentures?: No  CIWA:    COWS:     Musculoskeletal: Strength & Muscle Tone: within normal limits Gait & Station: normal Patient leans: N/A  Psychiatric Specialty Exam: Physical Exam  Nursing note and vitals reviewed.   Review of Systems  Constitutional: Negative for chills and fever.  Respiratory: Negative for cough and shortness of breath.   Cardiovascular: Negative for chest pain.  Gastrointestinal: Negative for abdominal pain, heartburn, nausea and vomiting.  Psychiatric/Behavioral: Positive for hallucinations (Paranoia). Negative for depression and suicidal ideas. The patient is nervous/anxious. The patient does not have insomnia.     Blood pressure 119/84, pulse 92, temperature 98.7 F (37.1 C), resp. rate 20, SpO2 99 %.There is no height or weight on file to calculate BMI.  General Appearance: Casual  Eye Contact:  Good  Speech:  Clear and Coherent and Normal Rate  Volume:  Normal  Mood:  Anxious  Affect:  Appropriate and Congruent  Thought Process:  Coherent and Goal Directed  Orientation:  Full (Time, Place, and Person)  Thought Content:  Logical  Suicidal Thoughts:  No  Homicidal Thoughts:  No  Memory:  Immediate;   Fair Recent;   Fair Remote;   Fair  Judgement:  Poor  Insight:  Fair  Psychomotor Activity:  Normal  Concentration:  Concentration: Fair  Recall:  [employer] of  Knowledge:  Fair  Language:  Fair  Akathisia:  No  Handed:    AIMS (if indicated):     Assets:  Resilience  ADL's:  Intact  Cognition:  WNL  Sleep:  Number of Hours: 5.25   Treatment Plan Summary: Daily contact with patient to assess and evaluate symptoms and progress in treatment and Medication management   -Continue inpatient hospitalization.  -Will continue today 10/15/2017 plan as below except where it is noted.  -Schizoaffective disorder, bipolar type and PTSD   -DC'ed zyprexa   -Continue Lexapro 10 mg po Q daily.   -Completed Invega 6 mg po Q day (for 1 dose to test for tolerability).   -Administer Hinda Glatter Sustenna 234mg  IM once today 8/13 followed by Lorelei Pont 156mg  IM q28 days starting on 8/16  -Anxiety    -Continue Vistaril 25mg  po q8h prn anxiety  -Agitation     -Continue haldol 5mg  po/IM q6h prn agitation   -Continue ativan 1mg  po/IM q6h prn agitation    -Continue benadryl 25mg  po/IM q6h prn agitation  -Insomnia   -Continue trazodone 50mg  po qhs prn insomnia  -Hypokalemia   -Continue Potassium chloride CR 20 mEq po BID  -Encourage participation in groups and therapeutic milieu  -Disposition planning will be ongoing  Monica Stammer, NP, PMHNP, FNP-BC 10/15/2017, 3:09 PMPatient ID: Monica Byrd, female   DOB: 22-Nov-1985, 32 y.o.   MRN: [id number]

## 2017-10-16 NOTE — Progress Notes (Signed)
Pt was observed eating a snack in the dayroom.Pt attendedwrap-up group. Pt appears anxious in affect and mood. Pt was cautious/minimal with interaction. Pt was noted to be less paranoid this evening. Pt was engaged in conversation with staff. Pt states she hopes to be discharge by the end of the week. PRNs offered; Pt declined. Support and encouragement provided. Writer left water bottles brought from home for Pt by beside. Encourage Pt to push fluids.Will continue with POC.

## 2017-10-16 NOTE — Progress Notes (Signed)
Patient denies SI, HI and AVH.  Patient has been engaged in groups, compliant with medications and has had no incidents of behavioral dsycontrol this shift.   Assess patient for safety, offer medications as prescribed, and engage patient in 1:1 staff talk.   Patient able to contract for safety.  Continue to monitor as planned. 

## 2017-10-16 NOTE — Progress Notes (Signed)
Adult Psychoeducational Group Note  Date:  10/16/2017 Time:  8:49 PM  Group Topic/Focus:  Wrap-Up Group:   The focus of this group is to help patients review their daily goal of treatment and discuss progress on daily workbooks.  Participation Level:  Minimal  Participation Quality:  Appropriate  Affect:  Appropriate  Cognitive:  Alert  Insight: Appropriate  Engagement in Group:  Lacking  Modes of Intervention:  Discussion  Additional Comments:  Pt rated her day 9.5/10. She stated that her goal is to get back balanced and re-centered. Pt also stated that she has been isolative to her room but she is doing good.   Monica HailstoneCOOKE, [name] 10/16/2017, 8:49 PM

## 2017-10-16 NOTE — Progress Notes (Signed)
[facility]  10/16/2017 2:26 PM Monica Byrd  MRN:  [id number]  Subjective: Illyria reports, "I'm doing better. I got the injection yesterday. It made me feel dizzy for about 3-4 hours. I'm better today but, I did not sleep well last night. I had a dream, woke up, could not go back to sleep. I'm depressed because I keep thinking, how in the world did I end up in this hospital?  I'm wondering if my being in this hospital will prevent me from getting a job in the [employer] system?"   Monica Byrd is a 32 y/o F with history of schizoaffective bipolar type and PTSD who was admitted on IVC with worsening disorganization, paranoia, and intrusive thoughts. Pt was started on regimen of zyprexa.  As per intake SRA: 32 year old female, employed, has an 30 year old child, who is currently with the father. Patient was brought to ED via EMS . Originally EMS had been contacted for patient's son, but as per notes EMS deemed that child was OK, doing well, but that patient seemed confused , psychotic, internally preoccupied , reporting she felt she had been abducted. On admission to ED patient seemed fearful, confused and reported she felt that everybody could hear her, and that " something is coming out of my vagina that starts in my stomach". She denies alcohol or dug abuse- admission BAL negative, admission UDS positive for Cannabis Today patient presents as fair historian, appears depressed, vaguely anxious, guarded. States she felt " like everything was crumbling around me", but does not elaborate further. Endorses some sadness, anhedonia, poor sleep prior to admission. Today denies hallucinations. Patient endorses history of depression. As per chart patient has history of a prior psychiatric admission in May 2014 for depression, psychosis, at the time was diagnosed with Schizoaffective Disorder and PTSD, discharged on Prozac and Risperidone . She denies history of suicidal attempts . Prior to  this admission she was prescribed Buspar and Lexapro- at this time it is unclear if she was compliant with medication regimen.  Today upon evaluation: Monica Byrd is not as delusional/paranoid as she was noted to be yesterday. Her demeanor today is feeling worried about being in this hospital & wether her being here as a patient will prevent her from obtaining a job with the [employer] [facility]. She reports having received her antipsychotic injectable yeasterday & feeling dizzy afterwards for about 3-4 hours. She says she is doing better today. Complains of not being able to sleep well last night. She has agreed to the adjustment of the dose of her sleep aid. She denies SI/HI/AH/VH. Her appetite is good. She is tolerating her medications well.  She had no further questions, comments, or concerns.  Principal Problem: Schizoaffective disorder, bipolar type (HCC)  Diagnosis:   Patient Active Problem List   Diagnosis Date Noted  . Posttraumatic stress disorder [F43.10] 07/28/2012  . Schizoaffective disorder, bipolar type (HCC) [F25.0] 07/28/2012   Total Time spent with patient: 15 minutes  Past Psychiatric History: See H&P  Past Medical History:  Past Medical History:  Diagnosis Date  . Anxiety   . Depression   . Hypertension    History reviewed. No pertinent surgical history.  Family History: History reviewed. No pertinent family history.  Family Psychiatric  History: See H&P  Social History:  Social History   Substance and Sexual Activity  Alcohol Use Yes   Comment: 1-2 drinks occasionally      Social History   Substance and  Sexual Activity  Drug Use Yes  . Types: Marijuana   Comment: pt currently denies as of 10/10/17    Social History   Socioeconomic History  . Marital status: Single    Spouse name: Not on file  . Number of children: Not on file  . Years of education: Not on file  . Highest education level: Not on file  Occupational History  . Not on file  Social Needs   . Financial resource strain: Not on file  . Food insecurity:    Worry: Not on file    Inability: Not on file  . Transportation needs:    Medical: Not on file    Non-medical: Not on file  Tobacco Use  . Smoking status: Never Smoker  . Smokeless tobacco: Never Used  Substance and Sexual Activity  . Alcohol use: Yes    Comment: 1-2 drinks occasionally   . Drug use: Yes    Types: Marijuana    Comment: pt currently denies as of 10/10/17  . Sexual activity: Not on file  Lifestyle  . Physical activity:    Days per week: Not on file    Minutes per session: Not on file  . Stress: Not on file  Relationships  . Social connections:    Talks on phone: Not on file    Gets together: Not on file    Attends religious service: Not on file    Active member of club or organization: Not on file    Attends meetings of clubs or organizations: Not on file    Relationship status: Not on file  Other Topics Concern  . Not on file  Social History Narrative  . Not on file   Additional Social History:   Sleep: Good  Appetite:  Good  Current Medications: Current Facility-Administered Medications  Medication Dose Route Frequency Provider Last Rate Last Dose  . acetaminophen (TYLENOL) tablet 650 mg  650 mg Oral Q6H PRN Money, Gerlene Burdock, FNP      . alum & mag hydroxide-simeth (MAALOX/MYLANTA) 200-200-20 MG/5ML suspension 30 mL  30 mL Oral Q4H PRN Money, Feliz Beam B, FNP      . diphenhydrAMINE (BENADRYL) capsule 25 mg  25 mg Oral Q6H PRN Money, Gerlene Burdock, FNP       Or  . diphenhydrAMINE (BENADRYL) injection 25 mg  25 mg Intramuscular Q6H PRN Money, Gerlene Burdock, FNP   25 mg at 10/11/17 1855  . escitalopram (LEXAPRO) tablet 10 mg  10 mg Oral Daily Jolyne Loa T, MD   10 mg at 10/16/17 0734  . haloperidol (HALDOL) tablet 5 mg  5 mg Oral Q6H PRN Money, Gerlene Burdock, FNP       Or  . haloperidol lactate (HALDOL) injection 5 mg  5 mg Intramuscular Q6H PRN Money, Gerlene Burdock, FNP   5 mg at 10/11/17 1854  .  hydrOXYzine (ATARAX/VISTARIL) tablet 25 mg  25 mg Oral TID PRN Money, Gerlene Burdock, FNP   25 mg at 10/12/17 0901  . LORazepam (ATIVAN) tablet 1 mg  1 mg Oral Q6H PRN Money, Gerlene Burdock, FNP       Or  . LORazepam (ATIVAN) injection 1 mg  1 mg Intramuscular Q6H PRN Money, Gerlene Burdock, FNP   1 mg at 10/11/17 1853  . magnesium hydroxide (MILK OF MAGNESIA) suspension 30 mL  30 mL Oral Daily PRN Money, Gerlene Burdock, FNP      . [START ON 10/18/2017] paliperidone (INVEGA SUSTENNA) injection 156 mg  156 mg  Intramuscular Q28 days Jolyne Loa T, MD      . potassium chloride SA (K-DUR,KLOR-CON) CR tablet 20 mEq  20 mEq Oral BID Truman Hayward, FNP   20 mEq at 10/16/17 0734  . traZODone (DESYREL) tablet 50 mg  50 mg Oral QHS PRN Money, Gerlene Burdock, FNP       Lab Results:  No results found for this or any previous visit (from the past 48 hour(s)).  Blood Alcohol level:  Lab Results  Component Value Date   ETH <10 10/10/2017   ETH (H) 07/02/2008    108        LOWEST DETECTABLE LIMIT FOR SERUM ALCOHOL IS 5 mg/dL FOR MEDICAL PURPOSES ONLY   Metabolic Disorder Labs: Lab Results  Component Value Date   HGBA1C 5.2 10/13/2017   MPG 103 10/13/2017   No results found for: PROLACTIN Lab Results  Component Value Date   CHOL 168 10/13/2017   TRIG 58 10/13/2017   HDL 48 10/13/2017   CHOLHDL 3.5 10/13/2017   VLDL 12 10/13/2017   LDLCALC 108 (H) 10/13/2017   Physical Findings: AIMS: Facial and Oral Movements Muscles of Facial Expression: None, normal Lips and Perioral Area: None, normal Jaw: None, normal Tongue: None, normal,Extremity Movements Upper (arms, wrists, hands, fingers): None, normal Lower (legs, knees, ankles, toes): None, normal, Trunk Movements Neck, shoulders, hips: None, normal, Overall Severity Severity of abnormal movements (highest score from questions above): None, normal Incapacitation due to abnormal movements: None, normal Patient's awareness of abnormal movements (rate only  patient's report): No Awareness, Dental Status Current problems with teeth and/or dentures?: No Does patient usually wear dentures?: No  CIWA:    COWS:     Musculoskeletal: Strength & Muscle Tone: within normal limits Gait & Station: normal Patient leans: N/A  Psychiatric Specialty Exam: Physical Exam  Nursing note and vitals reviewed.   Review of Systems  Constitutional: Negative for chills and fever.  Respiratory: Negative for cough and shortness of breath.   Cardiovascular: Negative for chest pain.  Gastrointestinal: Negative for abdominal pain, heartburn, nausea and vomiting.  Psychiatric/Behavioral: Positive for hallucinations (Paranoia). Negative for depression and suicidal ideas. The patient is nervous/anxious. The patient does not have insomnia.     Blood pressure (!) 131/92, pulse 97, temperature 98.7 F (37.1 C), resp. rate 20, SpO2 99 %.There is no height or weight on file to calculate BMI.  General Appearance: Casual  Eye Contact:  Good  Speech:  Clear and Coherent and Normal Rate  Volume:  Normal  Mood:  Anxious  Affect:  Appropriate and Congruent  Thought Process:  Coherent and Goal Directed  Orientation:  Full (Time, Place, and Person)  Thought Content:  Logical  Suicidal Thoughts:  No  Homicidal Thoughts:  No  Memory:  Immediate;   Fair Recent;   Fair Remote;   Fair  Judgement:  Poor  Insight:  Fair  Psychomotor Activity:  Normal  Concentration:  Concentration: Fair  Recall:  [employer] of Knowledge:  Fair  Language:  Fair  Akathisia:  No  Handed:    AIMS (if indicated):     Assets:  Resilience  ADL's:  Intact  Cognition:  WNL  Sleep:  Number of Hours: 6.5   Treatment Plan Summary: Daily contact with patient to assess and evaluate symptoms and progress in treatment and Medication management   -Continue inpatient hospitalization.  -Will continue today 10/16/2017 plan as below except where it is noted.  -Schizoaffective disorder, bipolar type  and PTSD   -DC'ed zyprexa   -Continue Lexapro 10 mg po Q daily.   -Completed Invega 6 mg po Q day (for 1 dose to test for tolerability).   -Administer Hinda Glatternvega Sustenna 234mg  IM once today 8/13 followed by Lorelei PontSustenna 156mg  IM q28 days starting on 8/16  -Anxiety    -Continue Vistaril 25mg  po q8h prn anxiety  -Agitation     -Continue haldol 5mg  po/IM q6h prn agitation   -Continue ativan 1mg  po/IM q6h prn agitation    -Continue benadryl 25mg  po/IM q6h prn agitation  -Insomnia   -Increased  trazodone from 50mg  po qhs prn to Trazodone 100 mg po Q hs prn insomnia  -Hypokalemia   -Continue Potassium chloride CR 20 mEq po BID  -Encourage participation in groups and therapeutic milieu  -Disposition planning will be ongoing  Armandina StammerAgnes Nwoko, NP, PMHNP, FNP-BC 10/16/2017, 2:26 PMPatient ID: Monica GottronAnastacia N Byrd, female   DOB: Nov 25, 1985, 32 y.o.   MRN: [id number]

## 2017-10-16 NOTE — Therapy (Signed)
Occupational Therapy Group Note  Date:  10/16/2017 Time:  3:35 PM  Group Topic/Focus:  Stress Management  Participation Level:  Minimal  Participation Quality:  Inattentive  Affect:  Flat  Cognitive:  Appropriate  Insight: Improving  Engagement in Group:  Limited  Modes of Intervention:  Activity, Discussion, Education and Socialization  Additional Comments:    S: "I like to paint"  O: Education given on stress management and healthy coping mechanisms. Pt encouraged to brainstorm with other peers and discuss what has worked in the past vs what has not. Pts further encouraged to discuss new coping stress management strategies to implement this date. Art activity made to display preferred coping mechanisms, along with incorporating the stress management outlet of coloring/art.  ?  A: Pt presents to group with extreme flat affect,needing encouragement to participate. Pt completed art activity successfully, engaging in coloring and activity. Pt very quiet and soft spoken identifies painting and singing as healthy coping strategies for stress. ?  P: Pt provided with education on stress management activities to implement into daily routine. Handouts given to facilitate carryover when reintegrating into communit   [facility] PasoKaylee Daisa Stennis, MSOT, OTR/L  AvnetKaylee Kesa Birky 10/16/2017, 3:35 PM

## 2017-10-17 NOTE — Tx Team (Signed)
Interdisciplinary Treatment and Diagnostic Plan Update  10/17/2017 Time of Session: 11:52 AM  EVALISE ABRUZZESE MRN: [id number]  Principal Diagnosis: Schizoaffective disorder, bipolar type ([place])  Secondary Diagnoses: Principal Problem:   Schizoaffective disorder, bipolar type ([place]) Active Problems:   Posttraumatic stress disorder   Current Medications:  Current Facility-Administered Medications  Medication Dose Route Frequency Provider Last Rate Last Dose  . acetaminophen (TYLENOL) tablet 650 mg  650 mg Oral Q6H PRN Money, Lowry Ram, FNP      . alum & mag hydroxide-simeth (MAALOX/MYLANTA) 200-200-20 MG/5ML suspension 30 mL  30 mL Oral Q4H PRN Money, Darnelle Maffucci B, FNP      . diphenhydrAMINE (BENADRYL) capsule 25 mg  25 mg Oral Q6H PRN Money, Lowry Ram, FNP       Or  . diphenhydrAMINE (BENADRYL) injection 25 mg  25 mg Intramuscular Q6H PRN Money, Lowry Ram, FNP   25 mg at 10/11/17 1855  . escitalopram (LEXAPRO) tablet 10 mg  10 mg Oral Daily Maris Berger T, MD   10 mg at 10/17/17 3818  . haloperidol (HALDOL) tablet 5 mg  5 mg Oral Q6H PRN Money, Lowry Ram, FNP       Or  . haloperidol lactate (HALDOL) injection 5 mg  5 mg Intramuscular Q6H PRN Money, Lowry Ram, FNP   5 mg at 10/11/17 1854  . hydrOXYzine (ATARAX/VISTARIL) tablet 25 mg  25 mg Oral TID PRN Money, Lowry Ram, FNP   25 mg at 10/12/17 0901  . LORazepam (ATIVAN) tablet 1 mg  1 mg Oral Q6H PRN Money, Lowry Ram, FNP       Or  . LORazepam (ATIVAN) injection 1 mg  1 mg Intramuscular Q6H PRN Money, Lowry Ram, FNP   1 mg at 10/11/17 1853  . magnesium hydroxide (MILK OF MAGNESIA) suspension 30 mL  30 mL Oral Daily PRN Money, Lowry Ram, FNP      . [START ON 10/18/2017] paliperidone (INVEGA SUSTENNA) injection 156 mg  156 mg Intramuscular Q28 days Maris Berger T, MD      . potassium chloride SA (K-DUR,KLOR-CON) CR tablet 20 mEq  20 mEq Oral BID Nanci Pina, FNP   20 mEq at 10/17/17 0752  . traZODone (DESYREL) tablet 50 mg  50  mg Oral QHS PRN Money, Lowry Ram, FNP        PTA Medications: Medications Prior to Admission  Medication Sig Dispense Refill Last Dose  . acetaminophen (TYLENOL) 500 MG tablet Take 500 mg by mouth every 6 (six) hours as needed for moderate pain.   Past Week at Unknown time  . busPIRone (BUSPAR) 5 MG tablet Take 1 tablet by mouth 3 (three) times daily.  2 Past Week at Unknown time  . escitalopram (LEXAPRO) 10 MG tablet Take 1 tablet by mouth daily.  5 Past Week at Unknown time  . metroNIDAZOLE (FLAGYL) 500 MG tablet Take 1 tablet (500 mg total) by mouth 2 (two) times daily. (Patient not taking: Reported on 10/10/2017) 14 tablet 0 Completed Course at Unknown time  . Multiple Vitamin (MULTIVITAMIN WITH MINERALS) TABS tablet Take 1 tablet by mouth daily.   Past Week at Unknown time  . ondansetron (ZOFRAN) 4 MG tablet Take 1 tablet (4 mg total) by mouth every 6 (six) hours. (Patient not taking: Reported on 10/10/2017) 12 tablet 0 Completed Course at Unknown time    Patient Stressors: Medication change or noncompliance Other: TBD  Patient Strengths: Motivation for treatment/growth Other: TBD  Treatment Modalities: Medication Management, Group  therapy, Case management,  1 to 1 session with clinician, Psychoeducation, Recreational therapy.   Physician Treatment Plan for Primary Diagnosis: Schizoaffective disorder, bipolar type ([place]) Long Term Goal(s): Improvement in symptoms so as ready for discharge  Short Term Goals: Ability to identify changes in lifestyle to reduce recurrence of condition will improve Ability to verbalize feelings will improve Ability to disclose and discuss suicidal ideas Ability to demonstrate self-control will improve Ability to identify and develop effective coping behaviors will improve Ability to maintain clinical measurements within normal limits will improve Compliance with prescribed medications will improve  Medication Management: Evaluate patient's response, side  effects, and tolerance of medication regimen.  Therapeutic Interventions: 1 to 1 sessions, Unit Group sessions and Medication administration.  Evaluation of Outcomes: Progressing   8/15: "I'm doing better. I got the injection yesterday. It made me feel dizzy for about 3-4 hours. I'm better today but, I did not sleep well last night. -Schizoaffective disorder, bipolar type and PTSD              -DC'ed zyprexa             -Continue Lexapro 10 mg po Q daily.             -Completed Invega 6 mg po Q day (for 1 dose to test for tolerability).             -Administer Kirt Boys 268m IM once today 8/13 followed by SWilder [id number] q28 days starting on 8/16  Physician Treatment Plan for Secondary Diagnosis: Principal Problem:   Schizoaffective disorder, bipolar type ([place] Problems:   Posttraumatic stress disorder   Long Term Goal(s): Improvement in symptoms so as ready for discharge  Short Term Goals: Ability to identify changes in lifestyle to reduce recurrence of condition will improve Ability to verbalize feelings will improve Ability to disclose and discuss suicidal ideas Ability to demonstrate self-control will improve Ability to identify and develop effective coping behaviors will improve Ability to maintain clinical measurements within normal limits will improve Compliance with prescribed medications will improve  Medication Management: Evaluate patient's response, side effects, and tolerance of medication regimen.  Therapeutic Interventions: 1 to 1 sessions, Unit Group sessions and Medication administration.  Evaluation of Outcomes: Progressing   RN Treatment Plan for Primary Diagnosis: Schizoaffective disorder, bipolar type ([place] Goal(s): Knowledge of disease and therapeutic regimen to maintain health will improve  Short Term Goals: Ability to identify and develop effective coping behaviors will improve and Compliance with prescribed medications will  improve  Medication Management: RN will administer medications as ordered by provider, will assess and evaluate patient's response and provide education to patient for prescribed medication. RN will report any adverse and/or side effects to prescribing provider.  Therapeutic Interventions: 1 on 1 counseling sessions, Psychoeducation, Medication administration, Evaluate responses to treatment, Monitor vital signs and CBGs as ordered, Perform/monitor CIWA, COWS, AIMS and Fall Risk screenings as ordered, Perform wound care treatments as ordered.  Evaluation of Outcomes: Progressing   LCSW Treatment Plan for Primary Diagnosis: Schizoaffective disorder, bipolar type (HCClevelandLong Term Goal(s): Safe transition to appropriate next level of care at discharge, Engage patient in therapeutic group addressing interpersonal concerns.  Short Term Goals: Engage patient in aftercare planning with referrals and resources  Therapeutic Interventions: Assess for all discharge needs, 1 to 1 time with Social worker, Explore available resources and support systems, Assess for adequacy in community support network, Educate family and significant other(s) on suicide prevention, Complete Psychosocial  Assessment, Interpersonal group therapy.  Evaluation of Outcomes: Met  Return home, follow up outpt   Progress in Treatment: Attending groups: Yes Participating in groups: Yes Taking medication as prescribed: Yes Toleration medication: Yes, no side effects reported at this time Family/Significant other contact made: No Patient understands diagnosis: No Limited insight Discussing patient identified problems/goals with staff: Yes Medical problems stabilized or resolved: Yes Denies suicidal/homicidal ideation: Yes Issues/concerns per patient self-inventory: None Other: N/A  New problem(s) identified: None identified at this time.   New Short Term/Long Term Goal(s): "I just want to get better so I can get back to my  family.There's a lot of noise going on around me. My OCD is taking over.  It feels like germs are everywhere."   Discharge Plan or Barriers:   Reason for Continuation of Hospitalization: Disorganization Depression Hallucinations  Medication stabilization   Estimated Length of Stay: 8/20  Attendees: Patient:  10/17/2017  11:52 AM  Physician: Maris Berger, MD 10/17/2017  11:52 AM  Nursing: Elesa Massed, RN 10/17/2017  11:52 AM  RN Care Manager: Lars Pinks, RN 10/17/2017  11:52 AM  Social Worker: Ripley Fraise 10/17/2017  11:52 AM  Recreational Therapist: Winfield Cunas 10/17/2017  11:52 AM  Other: Norberto Sorenson 10/17/2017  11:52 AM  Other:  10/17/2017  11:52 AM    Scribe for Treatment Team:  Roque Lias LCSW 10/17/2017 11:52 AM

## 2017-10-17 NOTE — Progress Notes (Signed)
   10/17/17 0500  Sleep  Number of Hours 4.25

## 2017-10-17 NOTE — [facility] Group Notes (Signed)
LCSW Group Therapy Note  10/17/2017 1:15pm  Type of Therapy and Topic:  Group Therapy:  Feelings around Relapse and Recovery  Participation Level:  Active   Description of Group:    Patients in this group will discuss emotions they experience before and after a relapse. They will process how experiencing these feelings, or avoidance of experiencing them, relates to having a relapse. Facilitator will guide patients to explore emotions they have related to recovery. Patients will be encouraged to process which emotions are more powerful. They will be guided to discuss the emotional reaction significant others in their lives may have to their relapse or recovery. Patients will be assisted in exploring ways to respond to the emotions of others without this contributing to a relapse.  Therapeutic Goals: 1. Patient will identify two or more emotions that lead to a relapse for them 2. Patient will identify two emotions that result when they relapse 3. Patient will identify two emotions related to recovery 4. Patient will demonstrate ability to communicate their needs through discussion and/or role plays   Summary of Patient Progress:  Stayed the entire time, engaged throughout.  Spoke more than any previous group. Talked about Maslow's Herarchy of Human Needs, and how she has slipped from self-actualization down a couple of categories.  Also talked about being here fro help with "bad memories," and while she did not want to talk about more specifics, she did state that they have been "amplified"recently, and how she needs to "recalibrate."  Therapeutic Modalities:   Cognitive Behavioral Therapy Solution-Focused Therapy Assertiveness Training Relapse Prevention Therapy   Ida RogueRodney B Kunal Levario, LCSW 10/17/2017 5:13 PM

## 2017-10-17 NOTE — Progress Notes (Signed)
Adult Psychoeducational Group Note  Date:  10/17/2017 Time:  9:29 PM  Group Topic/Focus:  Wrap-Up Group:   The focus of this group is to help patients review their daily goal of treatment and discuss progress on daily workbooks.  Participation Level:  Active  Participation Quality:  Appropriate  Affect:  Appropriate  Cognitive:  Appropriate  Insight: Appropriate  Engagement in Group:  Engaged  Modes of Intervention:  Discussion  Additional Comments:  Patient attended wrap-up group and said that her day was a 5. He spend her day reading and sleeping.   Loel Betancur W Roshanna Cimino 10/17/2017, 9:29 PM

## 2017-10-17 NOTE — Progress Notes (Signed)
Patient denies SI, HI, AVH this shift. Patient has attended groups, engaged in unit activities and been compliant with medications.  Patient has had no incidents of behavioral dyscontrol this shift.   Assess patient for safety, offer medications as planned, engage patient in 1:1 staff talks.   Patient able to contract for safety.  Continue to monitor as planned.  

## 2017-10-17 NOTE — Progress Notes (Signed)
[facility] MD Progress Note  10/17/2017 3:47 PM Monica Byrd  MRN:  [id number]  Subjective: Monica Byrd reports, "I'm doing a lot better. But people are still trying to put ideas in my head & confuse me. They are telling me stuff, their problems & everything. I don't know what to say. I'm feeling good now. When am I getting discharged?  Monica Byrd is a 32 y/o F with history of schizoaffective bipolar type and PTSD who was admitted on IVC with worsening disorganization, paranoia, and intrusive thoughts. Pt was started on regimen of zyprexa.  As per intake SRA: 32 year old female, employed, has an 54 year old child, who is currently with the father. Patient was brought to ED via EMS . Originally EMS had been contacted for patient's son, but as per notes EMS deemed that child was OK, doing well, but that patient seemed confused , psychotic, internally preoccupied , reporting she felt she had been abducted. On admission to ED patient seemed fearful, confused and reported she felt that everybody could hear her, and that " something is coming out of my vagina that starts in my stomach". She denies alcohol or dug abuse- admission BAL negative, admission UDS positive for Cannabis Today patient presents as fair historian, appears depressed, vaguely anxious, guarded. States she felt " like everything was crumbling around me", but does not elaborate further. Endorses some sadness, anhedonia, poor sleep prior to admission. Today denies hallucinations. Patient endorses history of depression. As per chart patient has history of a prior psychiatric admission in May 2014 for depression, psychosis, at the time was diagnosed with Schizoaffective Disorder and PTSD, discharged on Prozac and Risperidone . She denies history of suicidal attempts . Prior to this admission she was prescribed Buspar and Lexapro- at this time it is unclear if she was compliant with medication regimen.  Today upon evaluation: Monica Byrd reports  feeling & doing good. However, she remains paranoid saying people are putting ideas in her head, telling her their problems & trying to confuse her. She is instructed to tell any patient that tries to tell her his/her problems to come to one of the staff & will try to help the patient. She currently denies any SIHI, AVH or delusions. Her appetite is good. She is tolerating her medications well & has agreed to continue her current plan of care in progress. She had no further questions, comments, or concerns.  Principal Problem: Schizoaffective disorder, bipolar type (HCC)  Diagnosis:   Patient Active Problem List   Diagnosis Date Noted  . Posttraumatic stress disorder [F43.10] 07/28/2012  . Schizoaffective disorder, bipolar type (HCC) [F25.0] 07/28/2012   Total Time spent with patient: 15 minutes  Past Psychiatric History: See H&P  Past Medical History:  Past Medical History:  Diagnosis Date  . Anxiety   . Depression   . Hypertension    History reviewed. No pertinent surgical history.  Family History: History reviewed. No pertinent family history.  Family Psychiatric  History: See H&P  Social History:  Social History   Substance and Sexual Activity  Alcohol Use Yes   Comment: 1-2 drinks occasionally      Social History   Substance and Sexual Activity  Drug Use Yes  . Types: Marijuana   Comment: pt currently denies as of 10/10/17    Social History   Socioeconomic History  . Marital status: Single    Spouse name: Not on file  . Number of children: Not on file  . Years of education:  Not on file  . Highest education level: Not on file  Occupational History  . Not on file  Social Needs  . Financial resource strain: Not on file  . Food insecurity:    Worry: Not on file    Inability: Not on file  . Transportation needs:    Medical: Not on file    Non-medical: Not on file  Tobacco Use  . Smoking status: Never Smoker  . Smokeless tobacco: Never Used  Substance and  Sexual Activity  . Alcohol use: Yes    Comment: 1-2 drinks occasionally   . Drug use: Yes    Types: Marijuana    Comment: pt currently denies as of 10/10/17  . Sexual activity: Not on file  Lifestyle  . Physical activity:    Days per week: Not on file    Minutes per session: Not on file  . Stress: Not on file  Relationships  . Social connections:    Talks on phone: Not on file    Gets together: Not on file    Attends religious service: Not on file    Active member of club or organization: Not on file    Attends meetings of clubs or organizations: Not on file    Relationship status: Not on file  Other Topics Concern  . Not on file  Social History Narrative  . Not on file   Additional Social History:   Sleep: Good  Appetite:  Good  Current Medications: Current Facility-Administered Medications  Medication Dose Route Frequency Provider Last Rate Last Dose  . acetaminophen (TYLENOL) tablet 650 mg  650 mg Oral Q6H PRN Money, Gerlene Burdockravis B, FNP      . alum & mag hydroxide-simeth (MAALOX/MYLANTA) 200-200-20 MG/5ML suspension 30 mL  30 mL Oral Q4H PRN Money, Feliz Beamravis B, FNP      . diphenhydrAMINE (BENADRYL) capsule 25 mg  25 mg Oral Q6H PRN Money, Gerlene Burdockravis B, FNP       Or  . diphenhydrAMINE (BENADRYL) injection 25 mg  25 mg Intramuscular Q6H PRN Money, Gerlene Burdockravis B, FNP   25 mg at 10/11/17 1855  . escitalopram (LEXAPRO) tablet 10 mg  10 mg Oral Daily Jolyne Loaainville, Christopher T, MD   10 mg at 10/17/17 [id number]  . haloperidol (HALDOL) tablet 5 mg  5 mg Oral Q6H PRN Money, Gerlene Burdockravis B, FNP       Or  . haloperidol lactate (HALDOL) injection 5 mg  5 mg Intramuscular Q6H PRN Money, Gerlene Burdockravis B, FNP   5 mg at 10/11/17 1854  . hydrOXYzine (ATARAX/VISTARIL) tablet 25 mg  25 mg Oral TID PRN Money, Gerlene Burdockravis B, FNP   25 mg at 10/12/17 0901  . LORazepam (ATIVAN) tablet 1 mg  1 mg Oral Q6H PRN Money, Gerlene Burdockravis B, FNP       Or  . LORazepam (ATIVAN) injection 1 mg  1 mg Intramuscular Q6H PRN Money, Gerlene Burdockravis B, FNP   1 mg at  10/11/17 1853  . magnesium hydroxide (MILK OF MAGNESIA) suspension 30 mL  30 mL Oral Daily PRN Money, Gerlene Burdockravis B, FNP      . [START ON 10/18/2017] paliperidone (INVEGA SUSTENNA) injection 156 mg  156 mg Intramuscular Q28 days Jolyne Loaainville, Christopher T, MD      . potassium chloride SA (K-DUR,KLOR-CON) CR tablet 20 mEq  20 mEq Oral BID Truman HaywardStarkes, Takia S, FNP   20 mEq at 10/17/17 0752  . traZODone (DESYREL) tablet 50 mg  50 mg Oral QHS PRN Money, Gerlene Burdockravis B, FNP  Lab Results:  No results found for this or any previous visit (from the past 48 hour(s)).  Blood Alcohol level:  Lab Results  Component Value Date   ETH <10 10/10/2017   ETH (H) 07/02/2008    108        LOWEST DETECTABLE LIMIT FOR SERUM ALCOHOL IS 5 mg/dL FOR MEDICAL PURPOSES ONLY   Metabolic Disorder Labs: Lab Results  Component Value Date   HGBA1C 5.2 10/13/2017   MPG 103 10/13/2017   No results found for: PROLACTIN Lab Results  Component Value Date   CHOL 168 10/13/2017   TRIG 58 10/13/2017   HDL 48 10/13/2017   CHOLHDL 3.5 10/13/2017   VLDL 12 10/13/2017   LDLCALC 108 (H) 10/13/2017   Physical Findings: AIMS: Facial and Oral Movements Muscles of Facial Expression: None, normal Lips and Perioral Area: None, normal Jaw: None, normal Tongue: None, normal,Extremity Movements Upper (arms, wrists, hands, fingers): None, normal Lower (legs, knees, ankles, toes): None, normal, Trunk Movements Neck, shoulders, hips: None, normal, Overall Severity Severity of abnormal movements (highest score from questions above): None, normal Incapacitation due to abnormal movements: None, normal Patient's awareness of abnormal movements (rate only patient's report): No Awareness, Dental Status Current problems with teeth and/or dentures?: No Does patient usually wear dentures?: No  CIWA:    COWS:     Musculoskeletal: Strength & Muscle Tone: within normal limits Gait & Station: normal Patient leans: N/A  Psychiatric Specialty  Exam: Physical Exam  Nursing note and vitals reviewed.   Review of Systems  Constitutional: Negative for chills and fever.  Respiratory: Negative for cough and shortness of breath.   Cardiovascular: Negative for chest pain.  Gastrointestinal: Negative for abdominal pain, heartburn, nausea and vomiting.  Psychiatric/Behavioral: Positive for hallucinations (Paranoia). Negative for depression and suicidal ideas. The patient is nervous/anxious. The patient does not have insomnia.     Blood pressure 119/76, pulse (!) 115, temperature 98.4 F (36.9 C), temperature source Oral, resp. rate 16, SpO2 99 %.There is no height or weight on file to calculate BMI.  General Appearance: Casual  Eye Contact:  Good  Speech:  Clear and Coherent and Normal Rate  Volume:  Normal  Mood:  Anxious  Affect:  Appropriate and Congruent  Thought Process:  Coherent and Goal Directed  Orientation:  Full (Time, Place, and Person)  Thought Content:  Logical  Suicidal Thoughts:  No  Homicidal Thoughts:  No  Memory:  Immediate;   Fair Recent;   Fair Remote;   Fair  Judgement:  Poor  Insight:  Fair  Psychomotor Activity:  Normal  Concentration:  Concentration: Fair  Recall:  [employer] of Knowledge:  Fair  Language:  Fair  Akathisia:  No  Handed:    AIMS (if indicated):     Assets:  Resilience  ADL's:  Intact  Cognition:  WNL  Sleep:  Number of Hours: 4.25   Treatment Plan Summary: Daily contact with patient to assess and evaluate symptoms and progress in treatment and Medication management   -Continue inpatient hospitalization.  -Will continue today 10/17/2017 plan as below except where it is noted.  -Schizoaffective disorder, bipolar type and PTSD  -DC'ed zyprexa   -Continue Lexapro 10 mg po Q daily.   -Completed Invega 6 mg po Q day (for 1 dose to test for tolerability).   -Administer Gean Birchwood 234mg  IM once on 8/13 followed by Sustenna 156mg  IM q28 days starting on 8/16  -Anxiety     -Continue Vistaril  25mg  po q8h prn anxiety  -Agitation     -Continue haldol 5mg  po/IM q6h prn agitation   -Continue ativan 1mg  po/IM q6h prn agitation    -Continue benadryl 25mg  po/IM q6h prn agitation  -Insomnia   -Increased  trazodone from 50mg  po qhs prn to Trazodone 100 mg po Q hs prn insomnia  -Hypokalemia   -Continue Potassium chloride CR 20 mEq po BID  -Encourage participation in groups and therapeutic milieu  -Disposition planning will be ongoing  Armandina StammerAgnes Koleen Celia, NP, PMHNP, FNP-BC 10/17/2017, 3:47 PMPatient ID: Monica GottronAnastacia N Byrd, female   DOB: 1985/08/26, 32 y.o.   MRN: [id number]

## 2017-10-18 LAB — URINALYSIS, ROUTINE W REFLEX MICROSCOPIC
BILIRUBIN URINE: NEGATIVE
Glucose, UA: NEGATIVE mg/dL
Ketones, ur: 5 mg/dL — AB
NITRITE: NEGATIVE
PROTEIN: NEGATIVE mg/dL
Specific Gravity, Urine: 1.023 (ref 1.005–1.030)
pH: 5 (ref 5.0–8.0)

## 2017-10-18 NOTE — Progress Notes (Signed)
D: Pt presents anxious with flat affect. Remains paranoid on interactions with OCD tendencies. Paranoid about staff or her touching her toothbrush while in plastic or medication while in pill pack "my hands are dirty, I don't want to touch the plastic on the toothbrush". Observed rearranging her room on multiple occasions. Rates her anxiety, depression and hopelessness 0/10. Reports she's eating and sleeping well. Pt's goal is "getting out of here to be able to get back to work". Pt showered and changed scrubs. A: Scheduled medications given per order with verbal education and effects monitored. Encouraged pt to voice concerns and comply with treatment regimen including groups. Q 15 minutes safety checks maintained without self harm gestures. R: Pt receptive to care. Attended and participated in unit groups. Compliant with medications when offered, denies adverse drug reactions when assessed. POC continues for safety and mood stability.

## 2017-10-18 NOTE — Plan of Care (Signed)
D: Pt denies SI/HI/AVH. Pt is pleasant and cooperative. Pt visible on the unit this evening. Pt stated she felt" a lot better, I can tell the medicine is working". Pt continues to be paranoid  suspicious but is pleasant this evening.   A: Pt was offered support and encouragement. Pt was given scheduled medications. Pt was encourage to attend groups. Q 15 minute checks were done for safety.   R:Pt attends groups and interacts well with peers and staff. Pt is taking medication. Pt receptive to treatment and safety maintained on unit.   Problem: Education: Goal: Emotional status will improve Outcome: Progressing   Problem: Education: Goal: Mental status will improve Outcome: Progressing   Problem: Activity: Goal: Interest or engagement in activities will improve Outcome: Progressing   Problem: Activity: Goal: Sleeping patterns will improve Outcome: Progressing   Problem: Coping: Goal: Ability to demonstrate self-control will improve Outcome: Progressing   Problem: Health Behavior/Discharge Planning: Goal: Compliance with treatment plan for underlying cause of condition will improve Outcome: Progressing

## 2017-10-18 NOTE — Plan of Care (Signed)
  Problem: Education: Goal: Will be free of psychotic symptoms Outcome: Progressing-she remains delusional and paranoid but is out more on the unit.

## 2017-10-18 NOTE — Progress Notes (Signed)
[facility] MD Progress Note  10/18/2017 11:19 AM Monica Byrd  MRN:  [id number] Subjective:    Monica Byrd is a 32 y/o F with history of schizoaffective bipolar type and PTSD who was admitted on IVC with worsening disorganization, paranoia, and intrusive thoughts. Pt was started on regimen of zyprexa.  As per intake SRA: 32 year old female, employed, has an 68 year old child, who is currently with the father. Patient was brought to ED via EMS . Originally EMS had been contacted for patient's son, but as per notes EMS deemed that child was OK, doing well, but that patient seemed confused , psychotic, internally preoccupied , reporting she felt she had been abducted. On admission to ED patient seemed fearful, confused and reported she felt that everybody could hear her, and that " something is coming out of my vagina that starts in my stomach". She denies alcohol or dug abuse- admission BAL negative, admission UDS positive for Cannabis Today patient presents as fair historian, appears depressed, vaguely anxious, guarded. States she felt " like everything was crumbling around me", but does not elaborate further. Endorses some sadness, anhedonia, poor sleep prior to admission. Today denies hallucinations. Patient endorses history of depression. As per chart patient has history of a prior psychiatric admission in May 2014 for depression, psychosis, at the time was diagnosed with Schizoaffective Disorder and PTSD, discharged on Prozac and Risperidone . She denies history of suicidal attempts . Prior to this admission she was prescribed Buspar and Lexapro- at this time it is unclear if she was compliant with medication regimen.  Today upon evaluation:  Pt shares, "I'm doing okay. I was wondering about when I might be released because I was thinking about this job opportunity." Pt was asked about her symptoms, and she shares, "I'm still feeling kind of paranoid." However, she characterizes her symptoms  as improving and the intensity of her paranoia as diminishing. She is sleeping well. Her appetite is good. She denies other physical complaints. She denies SI/HI/AH/VH. She is tolerating her medications well, and she is in agreement to continue her current regimen without changes. Discussed with patient that we will plan to administer the booster injection of [place] today, and she was in agreeement. She had no further questions, comments, or concerns.   Principal Problem: Schizoaffective disorder, bipolar type (HCC) Diagnosis:   Patient Active Problem List   Diagnosis Date Noted  . Posttraumatic stress disorder [F43.10] 07/28/2012  . Schizoaffective disorder, bipolar type (HCC) [F25.0] 07/28/2012   Total Time spent with patient: 30 minutes  Past Psychiatric History: see H&P  Past Medical History:  Past Medical History:  Diagnosis Date  . Anxiety   . Depression   . Hypertension    History reviewed. No pertinent surgical history. Family History: History reviewed. No pertinent family history. Family Psychiatric  History: see H&P Social History:  Social History   Substance and Sexual Activity  Alcohol Use Yes   Comment: 1-2 drinks occasionally      Social History   Substance and Sexual Activity  Drug Use Yes  . Types: Marijuana   Comment: pt currently denies as of 10/10/17    Social History   Socioeconomic History  . Marital status: Single    Spouse name: Not on file  . Number of children: Not on file  . Years of education: Not on file  . Highest education level: Not on file  Occupational History  . Not on file  Social Needs  .  Financial resource strain: Not on file  . Food insecurity:    Worry: Not on file    Inability: Not on file  . Transportation needs:    Medical: Not on file    Non-medical: Not on file  Tobacco Use  . Smoking status: Never Smoker  . Smokeless tobacco: Never Used  Substance and Sexual Activity  . Alcohol use: Yes    Comment: 1-2  drinks occasionally   . Drug use: Yes    Types: Marijuana    Comment: pt currently denies as of 10/10/17  . Sexual activity: Not on file  Lifestyle  . Physical activity:    Days per week: Not on file    Minutes per session: Not on file  . Stress: Not on file  Relationships  . Social connections:    Talks on phone: Not on file    Gets together: Not on file    Attends religious service: Not on file    Active member of club or organization: Not on file    Attends meetings of clubs or organizations: Not on file    Relationship status: Not on file  Other Topics Concern  . Not on file  Social History Narrative  . Not on file   Additional Social History:                         Sleep: Good  Appetite:  Good  Current Medications: Current Facility-Administered Medications  Medication Dose Route Frequency Provider Last Rate Last Dose  . acetaminophen (TYLENOL) tablet 650 mg  650 mg Oral Q6H PRN Money, Gerlene Burdockravis B, FNP      . alum & mag hydroxide-simeth (MAALOX/MYLANTA) 200-200-20 MG/5ML suspension 30 mL  30 mL Oral Q4H PRN Money, Feliz Beamravis B, FNP      . diphenhydrAMINE (BENADRYL) capsule 25 mg  25 mg Oral Q6H PRN Money, Gerlene Burdockravis B, FNP       Or  . diphenhydrAMINE (BENADRYL) injection 25 mg  25 mg Intramuscular Q6H PRN Money, Gerlene Burdockravis B, FNP   25 mg at 10/11/17 1855  . escitalopram (LEXAPRO) tablet 10 mg  10 mg Oral Daily Jolyne Loaainville, Daneille Desilva T, MD   10 mg at 10/18/17 0748  . haloperidol (HALDOL) tablet 5 mg  5 mg Oral Q6H PRN Money, Gerlene Burdockravis B, FNP       Or  . haloperidol lactate (HALDOL) injection 5 mg  5 mg Intramuscular Q6H PRN Money, Gerlene Burdockravis B, FNP   5 mg at 10/11/17 1854  . hydrOXYzine (ATARAX/VISTARIL) tablet 25 mg  25 mg Oral TID PRN Money, Gerlene Burdockravis B, FNP   25 mg at 10/12/17 0901  . LORazepam (ATIVAN) tablet 1 mg  1 mg Oral Q6H PRN Money, Gerlene Burdockravis B, FNP       Or  . LORazepam (ATIVAN) injection 1 mg  1 mg Intramuscular Q6H PRN Money, Gerlene Burdockravis B, FNP   1 mg at 10/11/17 1853  .  magnesium hydroxide (MILK OF MAGNESIA) suspension 30 mL  30 mL Oral Daily PRN Money, Feliz Beamravis B, FNP      . paliperidone (INVEGA SUSTENNA) injection 156 mg  156 mg Intramuscular Q28 days Micheal Likensainville, Karalee Hauter T, MD   156 mg at 10/18/17 1008  . potassium chloride SA (K-DUR,KLOR-CON) CR tablet 20 mEq  20 mEq Oral BID Truman HaywardStarkes, Takia S, FNP   20 mEq at 10/18/17 0748  . traZODone (DESYREL) tablet 50 mg  50 mg Oral QHS PRN Money, Gerlene Burdockravis B, FNP  Lab Results: No results found for this or any previous visit (from the past 48 hour(s)).  Blood Alcohol level:  Lab Results  Component Value Date   ETH <10 10/10/2017   ETH (H) 07/02/2008    108        LOWEST DETECTABLE LIMIT FOR SERUM ALCOHOL IS 5 mg/dL FOR MEDICAL PURPOSES ONLY    Metabolic Disorder Labs: Lab Results  Component Value Date   HGBA1C 5.2 10/13/2017   MPG 103 10/13/2017   No results found for: PROLACTIN Lab Results  Component Value Date   CHOL 168 10/13/2017   TRIG 58 10/13/2017   HDL 48 10/13/2017   CHOLHDL 3.5 10/13/2017   VLDL 12 10/13/2017   LDLCALC 108 (H) 10/13/2017    Physical Findings: AIMS: Facial and Oral Movements Muscles of Facial Expression: None, normal Lips and Perioral Area: None, normal Jaw: None, normal Tongue: None, normal,Extremity Movements Upper (arms, wrists, hands, fingers): None, normal Lower (legs, knees, ankles, toes): None, normal, Trunk Movements Neck, shoulders, hips: None, normal, Overall Severity Severity of abnormal movements (highest score from questions above): None, normal Incapacitation due to abnormal movements: None, normal Patient's awareness of abnormal movements (rate only patient's report): No Awareness, Dental Status Current problems with teeth and/or dentures?: No Does patient usually wear dentures?: No  CIWA:    COWS:     Musculoskeletal: Strength & Muscle Tone: within normal limits Gait & Station: normal Patient leans: N/A  Psychiatric Specialty  Exam: Physical Exam  Nursing note and vitals reviewed.   Review of Systems  Constitutional: Negative for chills and fever.  Respiratory: Negative for cough and shortness of breath.   Cardiovascular: Negative for chest pain.  Gastrointestinal: Negative for abdominal pain, heartburn, nausea and vomiting.  Psychiatric/Behavioral: Negative for depression, hallucinations and suicidal ideas. The patient is not nervous/anxious and does not have insomnia.     Blood pressure 119/76, pulse (!) 115, temperature 98.4 F (36.9 C), temperature source Oral, resp. rate 16, height 5\' 4"  (1.626 m), weight 86 kg, SpO2 99 %.Body mass index is 32.53 kg/m.  General Appearance: Casual and Fairly Groomed  Eye Contact:  Good  Speech:  Clear and Coherent and Normal Rate  Volume:  Normal  Mood:  Anxious and Depressed  Affect:  Appropriate and Congruent  Thought Process:  Coherent and Goal Directed  Orientation:  Full (Time, Place, and Person)  Thought Content:  Delusions, Ideas of Reference:   Paranoia Delusions and Paranoid Ideation  Suicidal Thoughts:  No  Homicidal Thoughts:  No  Memory:  Immediate;   Fair Recent;   Fair Remote;   Fair  Judgement:  Fair  Insight:  Lacking  Psychomotor Activity:  Normal  Concentration:  Concentration: Fair  Recall:  [employer] of Knowledge:  Fair  Language:  Fair  Akathisia:  No  Handed:    AIMS (if indicated):     Assets:  Resilience Social Support  ADL's:  Intact  Cognition:  WNL  Sleep:  Number of Hours: 6.5   Treatment Plan Summary: Daily contact with patient to assess and evaluate symptoms and progress in treatment and Medication management   -Continue inpatient hospitalization.  -Will continue today 10/18/2017 plan as below except where it is noted.  -Schizoaffective disorder, bipolar type and PTSD             -Continue Lexapro 10 mg po Q daily.             - Continue Gean Birchwood 156mg  IM q28  days (starting today 8/16. Hinda GlatterInvega Sustenna 234mg  IM  given once on 8/13).   -Anxiety                        -Continue Vistaril 25mg  po q8h prn anxiety  -Agitation                       -Continue haldol 5mg  po/IM q6h prn agitation              -Continue ativan 1mg  po/IM q6h prn agitation                       -Continue benadryl 25mg  po/IM q6h prn agitation  -Insomnia             -Continue Trazodone 100 mg po Q hs prn insomnia  -Hypokalemia              -Continue Potassium chloride CR 20 mEq po BID  -Encourage participation in groups and therapeutic milieu  -Disposition planning will be ongoing   Micheal Likenshristopher T Bridgitt Raggio, MD 10/18/2017, 11:19 AM

## 2017-10-18 NOTE — Progress Notes (Signed)
D:  Monica Byrd has been in her room much of the evening.  She does come out of the room for groups but goes back to her room.  She remains paranoid/guarded and will not talk to staff unless the door is shut to her room.  She is very concerned about her period ending and having a "bad smell."  She is worried that she has an infection in her bladder or vagina.  [facility] and specimen cup placed in her bathroom.  She denied any SI/HI or A/V hallucinations.   She declined needing medications this evening.  She is currently resting with her eyes closed and appeared to be in no physical distress. A:  1:1 with RN for support and encouragement.  Medications as ordered.  Q 15 minute checks maintained for safety.  Encouraged participation in group and unit activities.   R:  Monica Byrd remains safe on the unit.  We will continue to monitor the progress towards her goals.

## 2017-10-18 NOTE — [facility] Group Notes (Signed)
[facility] LCSW Group Therapy  10/18/2017  1:05 PM  Type of Therapy:  Group therapy  Participation Level:  Active  Participation Quality:  Attentive  Affect:  Flat  Cognitive:  Oriented  Insight:  Limited  Engagement in Therapy:  Limited  Modes of Intervention:  Discussion, Socialization  Summary of Progress/Problems:  Chaplain was here to lead a group on themes of hope and courage. Stayed the entire time, sitting stiffly in her chair looking forward. "My hope is to get back home and be reunited with my child again. He's 8.  I just feel like he is picking up on all the confusion in the air, and I know a part of it is from me, like he doesn't understand what is going on with his mom. I think his dad is trying to protect him from me in this state.  I'm trying to stay grounded so that I can get out and be agood mother to him. And I'm sorry what I have done to my body.  I wish I had never gotten these tattoos, because I think people are afraid of me." Daryel Geraldorth, Catha Ontko B 10/18/2017 1:30 PM

## 2017-10-18 NOTE — [facility] Suicide Risk Assessment (Addendum)
[facility] INPATIENT:  Family/Significant Other Suicide Prevention Education  Suicide Prevention Education:  Contact Attempts: Monica Byrd, mother, [phone] has been identified by the patient as the family member/significant other with whom the patient will be residing, and identified as the person(s) who will aid the patient in the event of a mental health crisis.  With written consent from the patient, two attempts were made to provide suicide prevention education, prior to and/or following the patient's discharge.  We were unsuccessful in providing suicide prevention education.  A suicide education pamphlet was given to the patient to share with family/significant other.  Date and time of first attempt:10/15/2017 3:30 PM Date and time of second attempt:10/21/2017 9:03 AM  Spoke with mother who states plan is for [place] stay with [place] in [place] for continued support, and [facility]   Monica Byrd 10/18/2017, 10:52 AM

## 2017-10-19 NOTE — Progress Notes (Signed)
Pt up paranoid and obsessed that her bathroom needs cleaning. Pt pulled her shower curtain down and insisted we clean her bathroom with bleach wipes. Pt informed we could get environmental services to clean her room in the morning. Pt obsessed with the bacteria in her room.

## 2017-10-19 NOTE — [facility] Group Notes (Signed)
LCSW Group Therapy Note  10/19/2017    Type of Therapy and Topic:  Group Therapy: Anger and Coping Skills  Participation Level:  Active   Description of Group:   In this group, patients learned how to recognize the physical, cognitive, emotional, and behavioral responses they have to anger-provoking situations.  They identified how they usually or often react when angered, and learned how healthy and unhealthy coping skills work initially, but the unhealthy ones stop working.   They analyzed how their frequently-chosen coping skill is possibly beneficial and how it is possibly unhelpful.  The group discussed a variety of healthier coping skills that could help in resolving the actual issues, as well as how to go about planning for the the possibility of future similar situations.  Therapeutic Goals: 1. Patients will identify one thing that makes them angry and how they feel emotionally and physically, what their thoughts are or tend to be in those situations, and what healthy or unhealthy coping mechanism they typically use 2. Patients will identify how their coping technique works for them, as well as how it works against them. 3. Patients will explore possible new behaviors to use in future anger situations. 4. Patients will learn that anger itself is normal and cannot be eliminated, and that healthier coping skills can assist with resolving conflict rather than worsening situations.  Summary of Patient Progress:  The patient shared that she was "angry" a few minutes ago when she tried to explain herself to people who did not understand her, and therefore she has no freedom to express herself and bottles it up.  Therapeutic Modalities:   Cognitive Behavioral Therapy Motivation Interviewing  Lynnell ChadMareida J Grossman-Orr  .

## 2017-10-19 NOTE — Plan of Care (Signed)
D: Patient presents paranoid, anxious, agitated. She asked staff to "recheck my urine" to see if she is pregnant. Patient educated that she already had a negative pregnancy test this admission. She asked for another patient to "leave me alone," but that patient had not bothered her this shift. It was reported that her peer became confused and entered her room by accident last night. She became upset that she could not be discharged and screamed in the hallway and stomped off. She reports poor sleep last night, and declined to have medication to help. Her appetite is good, energy normal and concentration good. She rates her depression, sense of hopelessness and anxiety 0/10. She complains of "numbness of toes 3/10." Patient denies SI/HI/AVH.  A: Patient checked q15 min, and checks reviewed. Reviewed medication changes with patient and educated on side effects. Educated patient on importance of attending group therapy sessions and educated on several coping skills. Encouarged participation in milieu through recreation therapy and attending meals with peers. Support and encouragement provided. Fluids offered. R: Patient receptive to education on medications, and is medication compliant. Patient contracts for safety on the unit. Goal: "ADLs (healthy). Continue to focus. I would like to go home."

## 2017-10-19 NOTE — Progress Notes (Signed)
Pt got up stating she would not come down to get her vitals done because she was smelly and she needed someone to clean her shower with bleach . Pt has been obsessed with her shower and cleaning it with bleach. Pt was informed we would get environmental services to clean it when they get here.

## 2017-10-19 NOTE — Progress Notes (Signed)
D: Patient approached RN asking to speak to privately. She shared that "I have to start work on the 19th or my job will be a done deal." She is afraid of losing her job, and is asking to be discharged.  A: I promised to pass this on to the provider, however, she has been educated numerous times that she is IVC and her discharge is up to the provider and her continued improvement. Educated importance of taking medications and attending group therapy sessions. R: Patient agreeable to having this information passed on.

## 2017-10-19 NOTE — Progress Notes (Signed)
[facility]  10/19/2017 10:27 AM Monica Byrd  MRN:  [id number]   Subjective: Patient reports that she is feeling good today.  She begins a conversation with I have been compliant with my medications and I would like to go home.  Patient states that she has been eating and sleeping good.  Patient denies any SI/HI/AVH and contracts for safety.  She denies any medication side effects.  Patient states she is hoping to be able to go home soon and that she was told by Dr. Altamese [place] that she would be discharged on Monday.  Patient is encouraged to follow with disposition plan and she finally agrees that that would be best to ensure her safety.  Objective: Patient's chart and findings reviewed and discussed with treatment team.  Patient presents in the hallway and is pleasant and cooperative.  Her most of the conversation patient has flat affect, but then patient does smile and laugh with me before leaving the room.  Patient is focused on getting discharged.  There was 1 comment made about another patient making comments about her father.  Consulted with staff and found that another patient did go into the patient's room last night and patient has been slightly paranoid since then.  He reports that she is still remain calm and cooperative and has been pleasant with staff this far.  We will continue current medications as prescribed.  Principal Problem: Schizoaffective disorder, bipolar type (HCC) Diagnosis:   Patient Active Problem List   Diagnosis Date Noted  . Posttraumatic stress disorder [F43.10] 07/28/2012  . Schizoaffective disorder, bipolar type (HCC) [F25.0] 07/28/2012   Total Time spent with patient: 20 minutes  Past Psychiatric History: See H&P  Past Medical History:  Past Medical History:  Diagnosis Date  . Anxiety   . Depression   . Hypertension    History reviewed. No pertinent surgical history. Family History: History reviewed. No pertinent family history. Family  Psychiatric  History: See H&P Social History:  Social History   Substance and Sexual Activity  Alcohol Use Yes   Comment: 1-2 drinks occasionally      Social History   Substance and Sexual Activity  Drug Use Yes  . Types: Marijuana   Comment: pt currently denies as of 10/10/17    Social History   Socioeconomic History  . Marital status: Single    Spouse name: Not on file  . Number of children: Not on file  . Years of education: Not on file  . Highest education level: Not on file  Occupational History  . Not on file  Social Needs  . Financial resource strain: Not on file  . Food insecurity:    Worry: Not on file    Inability: Not on file  . Transportation needs:    Medical: Not on file    Non-medical: Not on file  Tobacco Use  . Smoking status: Never Smoker  . Smokeless tobacco: Never Used  Substance and Sexual Activity  . Alcohol use: Yes    Comment: 1-2 drinks occasionally   . Drug use: Yes    Types: Marijuana    Comment: pt currently denies as of 10/10/17  . Sexual activity: Not on file  Lifestyle  . Physical activity:    Days per week: Not on file    Minutes per session: Not on file  . Stress: Not on file  Relationships  . Social connections:    Talks on phone: Not on file    Gets  together: Not on file    Attends religious service: Not on file    Active member of club or organization: Not on file    Attends meetings of clubs or organizations: Not on file    Relationship status: Not on file  Other Topics Concern  . Not on file  Social History Narrative  . Not on file   Additional Social History:                         Sleep: Patient reports sleeping good but only 2.5 hours of sleep are documented  Appetite:  Good  Current Medications: Current Facility-Administered Medications  Medication Dose Route Frequency Provider Last Rate Last Dose  . acetaminophen (TYLENOL) tablet 650 mg  650 mg Oral Q6H PRN Vania Rosero, Gerlene Burdock, FNP      . alum & mag  hydroxide-simeth (MAALOX/MYLANTA) 200-200-20 MG/5ML suspension 30 mL  30 mL Oral Q4H PRN Brendin Situ, Feliz Beam B, FNP      . diphenhydrAMINE (BENADRYL) capsule 25 mg  25 mg Oral Q6H PRN Adelis Docter, Gerlene Burdock, FNP       Or  . diphenhydrAMINE (BENADRYL) injection 25 mg  25 mg Intramuscular Q6H PRN Jachin Coury, Gerlene Burdock, FNP   25 mg at 10/11/17 1855  . escitalopram (LEXAPRO) tablet 10 mg  10 mg Oral Daily Jolyne Loa T, MD   10 mg at 10/19/17 0806  . haloperidol (HALDOL) tablet 5 mg  5 mg Oral Q6H PRN Chantil Bari, Gerlene Burdock, FNP       Or  . haloperidol lactate (HALDOL) injection 5 mg  5 mg Intramuscular Q6H PRN Naileah Karg, Gerlene Burdock, FNP   5 mg at 10/11/17 1854  . hydrOXYzine (ATARAX/VISTARIL) tablet 25 mg  25 mg Oral TID PRN Kadeisha Betsch, Gerlene Burdock, FNP   25 mg at 10/12/17 0901  . LORazepam (ATIVAN) tablet 1 mg  1 mg Oral Q6H PRN Johnanna Bakke, Gerlene Burdock, FNP       Or  . LORazepam (ATIVAN) injection 1 mg  1 mg Intramuscular Q6H PRN Katilyn Miltenberger, Gerlene Burdock, FNP   1 mg at 10/11/17 1853  . magnesium hydroxide (MILK OF MAGNESIA) suspension 30 mL  30 mL Oral Daily PRN Aldo Sondgeroth, Feliz Beam B, FNP      . paliperidone (INVEGA SUSTENNA) injection 156 mg  156 mg Intramuscular Q28 days Micheal Likens, MD   156 mg at 10/18/17 1008  . potassium chloride SA (K-DUR,KLOR-CON) CR tablet 20 mEq  20 mEq Oral BID Truman Hayward, FNP   20 mEq at 10/19/17 0806  . traZODone (DESYREL) tablet 50 mg  50 mg Oral QHS PRN Indy Prestwood, Gerlene Burdock, FNP        Lab Results:  Results for orders placed or performed during the hospital encounter of 10/11/17 (from the past 48 hour(s))  Urinalysis, Routine w reflex microscopic     Status: Abnormal   Collection Time: 10/18/17  6:54 PM  Result Value Ref Range   Color, Urine AMBER (A) YELLOW    Comment: BIOCHEMICALS MAY BE AFFECTED BY COLOR   APPearance TURBID (A) CLEAR   Specific Gravity, Urine 1.023 1.005 - 1.030   pH 5.0 5.0 - 8.0   Glucose, UA NEGATIVE NEGATIVE mg/dL   Hgb urine dipstick SMALL (A) NEGATIVE   Bilirubin Urine  NEGATIVE NEGATIVE   Ketones, ur 5 (A) NEGATIVE mg/dL   Protein, ur NEGATIVE NEGATIVE mg/dL   Nitrite NEGATIVE NEGATIVE   Leukocytes, UA MODERATE (A) NEGATIVE   RBC /  HPF 0-5 0 - 5 RBC/hpf   WBC, UA 11-20 0 - 5 WBC/hpf   Bacteria, UA RARE (A) NONE SEEN   Squamous Epithelial / LPF 6-10 0 - 5   Mucus PRESENT     Comment: Performed at [facility], [address] [place], [place] [id number]    Blood Alcohol level:  Lab Results  Component Value Date   ETH <10 10/10/2017   ETH (H) 07/02/2008    108        LOWEST DETECTABLE LIMIT FOR SERUM ALCOHOL IS 5 mg/dL FOR MEDICAL PURPOSES ONLY    Metabolic Disorder Labs: Lab Results  Component Value Date   HGBA1C 5.2 10/13/2017   MPG 103 10/13/2017   No results found for: PROLACTIN Lab Results  Component Value Date   CHOL 168 10/13/2017   TRIG 58 10/13/2017   HDL 48 10/13/2017   CHOLHDL 3.5 10/13/2017   VLDL 12 10/13/2017   LDLCALC 108 (H) 10/13/2017    Physical Findings: AIMS: Facial and Oral Movements Muscles of Facial Expression: None, normal Lips and Perioral Area: None, normal Jaw: None, normal Tongue: None, normal,Extremity Movements Upper (arms, wrists, hands, fingers): None, normal Lower (legs, knees, ankles, toes): None, normal, Trunk Movements Neck, shoulders, hips: None, normal, Overall Severity Severity of abnormal movements (highest score from questions above): None, normal Incapacitation due to abnormal movements: None, normal Patient's awareness of abnormal movements (rate only patient's report): No Awareness, Dental Status Current problems with teeth and/or dentures?: No Does patient usually wear dentures?: No  CIWA:    COWS:     Musculoskeletal: Byrd & Muscle Tone: within normal limits Gait & Station: normal Patient leans: N/A  Psychiatric Specialty Exam: Physical Exam  Nursing note and vitals reviewed. Constitutional: She is oriented to person, place, and time. She appears  well-developed and well-nourished.  Respiratory: Effort normal.  Musculoskeletal: Normal range of motion.  Neurological: She is alert and oriented to person, place, and time.  Skin: Skin is warm.    Review of Systems  Constitutional: Negative.   HENT: Negative.   Eyes: Negative.   Respiratory: Negative.   Cardiovascular: Negative.   Gastrointestinal: Negative.   Genitourinary: Negative.   Musculoskeletal: Negative.   Skin: Negative.   Neurological: Negative.   Endo/Heme/Allergies: Negative.   Psychiatric/Behavioral: Positive for depression. Negative for suicidal ideas. The patient is nervous/anxious.     Blood pressure 107/72, pulse (!) 116, temperature 98.4 F (36.9 C), temperature source Oral, resp. rate 20, height 5\' 4"  (1.626 m), weight 86 kg, SpO2 99 %.Body mass index is 32.53 kg/m.  General Appearance: Casual and Fairly Groomed  Eye Contact:  Good  Speech:  Clear and Coherent and Normal Rate  Volume:  Normal  Mood:  Depressed  Affect:  Congruent and Flat  Thought Process:  Goal Directed and Descriptions of Associations: Intact  Orientation:  Full (Time, Place, and Person)  Thought Content:  WDL  Suicidal Thoughts:  No  Homicidal Thoughts:  No  Memory:  Immediate;   Good Recent;   Good Remote;   Good  Judgement:  Fair  Insight:  Fair  Psychomotor Activity:  Normal  Concentration:  Concentration: Good and Attention Span: Good  Recall:  Good  Fund of Knowledge:  Fair  Language:  Good  Akathisia:  No  Handed:  Right  AIMS (if indicated):     Assets:  Desire for Improvement Financial Resources/Insurance Social Support  ADL's:  Intact  Cognition:  WNL  Sleep:  Number  of Hours: 2.5   Problems addressed Schizoaffective disorder bipolar type PTSD  Treatment Plan Summary: Daily contact with patient to assess and evaluate symptoms and progress in treatment, Medication management and Plan is to: -Continue Lexapro 10 mg p.o. daily for mood stability -Continue  Vistaril 25 mg p.o. 3 times daily as needed for anxiety -Continue trazodone 50 mg p.o. nightly as needed for insomnia -Continue Invega Sustenna 156 mg IM every 28 days for mood stability -Continue agitation protocol which includes diphenhydramine, Haldol, and/or lorazepam -Encourage group therapy participation  Maryfrances Bunnell, FNP 10/19/2017, 10:27 AM

## 2017-10-19 NOTE — [facility] Group Notes (Signed)
[facility] Group Notes:  (Nursing)  Date:  10/19/2017  Time: 1:30 PM Type of Therapy:  Nurse Education  Participation Level:  Did Not Attend   Shela NevinValerie S Zohair Epp 10/19/2017, 3:09 PM

## 2017-10-19 NOTE — Progress Notes (Signed)
Pt obsessed with another female pt, stating she came in her room. Staff did intercept another pt that went in her room, but pt has been obsessed with the pt stating she used things in her room and did some things in her bathroom . Pt presents very paranoid and delusional this evening.

## 2017-10-20 LAB — COMPREHENSIVE METABOLIC PANEL WITH GFR
ALT: 16 U/L (ref 0–44)
AST: 21 U/L (ref 15–41)
Albumin: 3.7 g/dL (ref 3.5–5.0)
Alkaline Phosphatase: 49 U/L (ref 38–126)
Anion gap: 9 (ref 5–15)
BUN: 11 mg/dL (ref 6–20)
CO2: 24 mmol/L (ref 22–32)
Calcium: 9 mg/dL (ref 8.9–10.3)
Chloride: 109 mmol/L (ref 98–111)
Creatinine, Ser: 1 mg/dL (ref 0.44–1.00)
GFR calc Af Amer: >60 mL/min
GFR calc non Af Amer: >60 mL/min
Glucose, Bld: 94 mg/dL (ref 70–99)
Potassium: 3.8 mmol/L (ref 3.5–5.1)
Sodium: 142 mmol/L (ref 135–145)
Total Bilirubin: 0.1 mg/dL — ABNORMAL LOW (ref 0.3–1.2)
Total Protein: 6.8 g/dL (ref 6.5–8.1)

## 2017-10-20 MED ORDER — FLUTICASONE PROPIONATE 50 MCG/ACT NA SUSP
2.0000 | Freq: Every day | NASAL | Status: DC
  Administered 2017-10-20: 2 via NASAL
  Filled 2017-10-20 (×2): qty 16

## 2017-10-20 MED ORDER — PROPRANOLOL HCL 10 MG PO TABS
10.0000 mg | ORAL_TABLET | Freq: Two times a day (BID) | ORAL | Status: DC
  Administered 2017-10-20 – 2017-10-21 (×2): 10 mg via ORAL
  Filled 2017-10-20: qty 14
  Filled 2017-10-20: qty 1
  Filled 2017-10-20: qty 14
  Filled 2017-10-20 (×3): qty 1

## 2017-10-20 NOTE — [facility] Group Notes (Signed)
[facility]  Date/Time:  10/20/2017  11:00AM-12:00PM  Type of Therapy and Topic:  Group Therapy:  Music and Mood  Participation Level:  Active   Description of Group: In this process group, members listened to a variety of genres of music and identified that different types of music evoke different responses.  Patients were encouraged to identify music that was soothing for them and music that was energizing for them.  Patients discussed how this knowledge can help with wellness and recovery in various ways including managing depression and anxiety as well as encouraging healthy sleep habits.    Therapeutic Goals: 1. Patients will explore the impact of different varieties of music on mood 2. Patients will verbalize the thoughts they have when listening to different types of music 3. Patients will identify music that is soothing to them as well as music that is energizing to them 4. Patients will discuss how to use this knowledge to assist in maintaining wellness and recovery 5. Patients will explore the use of music as a coping skill  Summary of Patient Progress:  At the beginning of group, patient expressed that she is feeling a "lot better" than previously.  She participated during group, smiling during quite a few songs and responding when asked questions directly.  At the end of group she stated she felt "relaxed and at peace."  Therapeutic Modalities: Solution Focused Brief Therapy Activity   Ambrose MantleMareida Grossman-Orr, LCSW

## 2017-10-20 NOTE — Plan of Care (Signed)
D: Patient presents anxious, paranoid. She is still asking to leave. Appears patient may have made several phone calls to 911 yesterday, and will monitor closely today to verify if this is the case. She appears paranoid and asks to speak to RN privately, just to ask if a pescatarian diet is okay for her with her medications. She slept well last night, and did not request medication to help with sleep. Her appetite is good, energy normal and concentration good. She rates her depression, feeling of hopelessness and anxiety are 0/10. She denies physical complaints or withdrawal symptoms. Patient denies SI/HI/AVH.  A: Patient checked q15 min, and checks reviewed. Reviewed medication changes with patient and educated on side effects. Educated patient on importance of attending group therapy sessions and educated on several coping skills. Encouarged participation in milieu through recreation therapy and attending meals with peers. Support and encouragement provided. Fluids offered. R: Patient receptive to education on medications, and is medication compliant. Patient contracts for safety on the unit. Her goal is "getting out of here, to get back to work, life, etc."

## 2017-10-20 NOTE — Progress Notes (Signed)
D: HR elevated at 92 sitting and 128 standing. Provider ordered EKG and to encourage fluids. A: Gave patient pitcher of Gatorade and encouraged fluids.  R: Patient agreeable to drinking more fluids.

## 2017-10-20 NOTE — Progress Notes (Signed)
D: Patient came to nurse's station multiple times with various complaints. She first asked if she was having an "MI" and if that was why she needed to be on propranolol. She just received her first dose of medication. Next, she asked if blood clots could be causing her heart to beat fast. She was afraid that her varicose veins were somehow related. A: Provided support and reassurance. Educated that her EKG was normal. Educated that propranolol helps with anxiety and lowers heart rate. R: Patient appeared satisfied with the education and went back to her room.

## 2017-10-20 NOTE — Progress Notes (Signed)
Adult Psychoeducational Group Note  Date:  10/20/2017 Time:  9:46 PM  Group Topic/Focus:  Wrap-Up Group:   The focus of this group is to help patients review their daily goal of treatment and discuss progress on daily workbooks.  Participation Level:  Minimal  Participation Quality:  Appropriate  Affect:  Flat  Cognitive:  Oriented  Insight: Limited  Engagement in Group:  Engaged  Modes of Intervention:  Socialization and Support  Additional Comments:  Patient attended and participated in group tonight. She reports that she was ready to go home. She don't know why she was here. Today she went to group, talk to her nurse. She went for her meals ane attended her groups. She will like to speak with her Child psychotherapistocial Worker.  Monica Byrd, Monica Byrd 10/20/2017, 9:46 PM

## 2017-10-20 NOTE — Progress Notes (Signed)
D: Pt was concerned, stating that she has to be at work the 19th. Stated, "I don't want to lose my job". Pt informed the Clinical research associatewriter that she graduated from [employer]. Stated, "this couldn't have happened at a worse time". Pt gave the analogy of riding in a car at full speed, then hitting a brick wall", referring to graduating college and getting a job. Pt also explained that her family thought she was attempting to kill herself because she was fasting. Stated, "I'm not fasting for religious reasons, I'm doing it for health reasons." Pt has no other questions or concerns.    A:  Support and encouragement was offered. 15 min checks continued for safety.  R: Pt remains safe.

## 2017-10-20 NOTE — Progress Notes (Addendum)
Patient ID: Monica Byrd, female   DOB: 14-Nov-1985, 32 y.o.   MRN: [id number]  [facility] MD Progress Note  10/20/2017 11:19 AM Monica Byrd  MRN:  [id number]   Subjective: Patient states, "I'm Ok."  Sleep is "well", appetite is "good".  Denies depression and anxiety along with hallucinations but does appear to responding to internal stimuli at times.  Paranoid and calling 911 on the unit yesterday and this morning.  "I just want to go home cause to be at work tomorrow."  Objective:Patient'Byrd chart and findings reviewed and discussed with treatment team. Patient presents in her room preparing for a shower.  Guarded affect with paranoia noted, appears to be responding to internal stimuli at times.  Reports eating well but tachycardic and nurses report minimal fluid intake--will place on close monitoring and encouragement of fluids.   Minimizes her symptoms and focuses on discharge. She presents alert, oriented &aware of situation.She is participating in the group sessions &activities.Anastaciais currently alert, wake, oriented x 4. She appears to be in no apparent distress.She currently denies any SIHI, AVH.She has agreed to continue her current plan of care already in progress.   Principal Problem: Schizoaffective disorder, bipolar type (HCC) Diagnosis:   Patient Active Problem List   Diagnosis Date Noted  . Posttraumatic stress disorder [F43.10] 07/28/2012    Priority: High  . Schizoaffective disorder, bipolar type (HCC) [F25.0] 07/28/2012    Priority: High   Total Time spent with patient: 25 minutes  Past Psychiatric History: See H&P  Past Medical History:  Past Medical History:  Diagnosis Date  . Anxiety   . Depression   . Hypertension    History reviewed. No pertinent surgical history. Family History: History reviewed. No pertinent family history. Family Psychiatric  History: See H&P Social History:  Social History   Substance and Sexual Activity  Alcohol Use  Yes   Comment: 1-2 drinks occasionally      Social History   Substance and Sexual Activity  Drug Use Yes  . Types: Marijuana   Comment: pt currently denies as of 10/10/17    Social History   Socioeconomic History  . Marital status: Single    Spouse name: Not on file  . Number of children: Not on file  . Years of education: Not on file  . Highest education level: Not on file  Occupational History  . Not on file  Social Needs  . Financial resource strain: Not on file  . Food insecurity:    Worry: Not on file    Inability: Not on file  . Transportation needs:    Medical: Not on file    Non-medical: Not on file  Tobacco Use  . Smoking status: Never Smoker  . Smokeless tobacco: Never Used  Substance and Sexual Activity  . Alcohol use: Yes    Comment: 1-2 drinks occasionally   . Drug use: Yes    Types: Marijuana    Comment: pt currently denies as of 10/10/17  . Sexual activity: Not on file  Lifestyle  . Physical activity:    Days per week: Not on file    Minutes per session: Not on file  . Stress: Not on file  Relationships  . Social connections:    Talks on phone: Not on file    Gets together: Not on file    Attends religious service: Not on file    Active member of club or organization: Not on file    Attends meetings of clubs or  organizations: Not on file    Relationship status: Not on file  Other Topics Concern  . Not on file  Social History Narrative  . Not on file   Additional Social History:     Sleep: Patient reports sleeping good but only 2.5 hours of sleep are documented  Appetite:  Good  Current Medications: Current Facility-Administered Medications  Medication Dose Route Frequency Provider Last Rate Last Dose  . acetaminophen (TYLENOL) tablet 650 mg  650 mg Oral Q6H PRN Money, Gerlene Burdockravis B, FNP      . alum & mag hydroxide-simeth (MAALOX/MYLANTA) 200-200-20 MG/5ML suspension 30 mL  30 mL Oral Q4H PRN Money, Monica Beamravis B, FNP      . diphenhydrAMINE  (BENADRYL) capsule 25 mg  25 mg Oral Q6H PRN Money, Gerlene Burdockravis B, FNP       Or  . diphenhydrAMINE (BENADRYL) injection 25 mg  25 mg Intramuscular Q6H PRN Money, Gerlene Burdockravis B, FNP   25 mg at 10/11/17 1855  . escitalopram (LEXAPRO) tablet 10 mg  10 mg Oral Daily Monica Byrd, Christopher T, MD   10 mg at 10/20/17 [id number]  . haloperidol (HALDOL) tablet 5 mg  5 mg Oral Q6H PRN Money, Gerlene Burdockravis B, FNP       Or  . haloperidol lactate (HALDOL) injection 5 mg  5 mg Intramuscular Q6H PRN Money, Gerlene Burdockravis B, FNP   5 mg at 10/11/17 1854  . hydrOXYzine (ATARAX/VISTARIL) tablet 25 mg  25 mg Oral TID PRN Money, Gerlene Burdockravis B, FNP   25 mg at 10/12/17 0901  . LORazepam (ATIVAN) tablet 1 mg  1 mg Oral Q6H PRN Money, Gerlene Burdockravis B, FNP       Or  . LORazepam (ATIVAN) injection 1 mg  1 mg Intramuscular Q6H PRN Money, Gerlene Burdockravis B, FNP   1 mg at 10/11/17 1853  . magnesium hydroxide (MILK OF MAGNESIA) suspension 30 mL  30 mL Oral Daily PRN Money, Monica Beamravis B, FNP      . paliperidone (INVEGA SUSTENNA) injection 156 mg  156 mg Intramuscular Q28 days Micheal Byrd, Christopher T, MD   156 mg at 10/18/17 1008  . potassium chloride SA (K-DUR,KLOR-CON) CR tablet 20 mEq  20 mEq Oral BID Monica Byrd, Monica S, FNP   20 mEq at 10/20/17 0806  . traZODone (DESYREL) tablet 50 mg  50 mg Oral QHS PRN Money, Gerlene Burdockravis B, FNP        Lab Results:  Results for orders placed or performed during the hospital encounter of 10/11/17 (from the past 48 hour(Byrd))  Urinalysis, Routine w reflex microscopic     Status: Abnormal   Collection Time: 10/18/17  6:54 PM  Result Value Ref Range   Color, Urine AMBER (A) YELLOW    Comment: BIOCHEMICALS MAY BE AFFECTED BY COLOR   APPearance TURBID (A) CLEAR   Specific Gravity, Urine 1.023 1.005 - 1.030   pH 5.0 5.0 - 8.0   Glucose, UA NEGATIVE NEGATIVE mg/dL   Hgb urine dipstick SMALL (A) NEGATIVE   Bilirubin Urine NEGATIVE NEGATIVE   Ketones, ur 5 (A) NEGATIVE mg/dL   Protein, ur NEGATIVE NEGATIVE mg/dL   Nitrite NEGATIVE NEGATIVE    Leukocytes, UA MODERATE (A) NEGATIVE   RBC / HPF 0-5 0 - 5 RBC/hpf   WBC, UA 11-20 0 - 5 WBC/hpf   Bacteria, UA RARE (A) NONE SEEN   Squamous Epithelial / LPF 6-10 0 - 5   Mucus PRESENT     Comment: Performed at [facility], [address]  [place], [place] [address]    Blood Alcohol level:  Lab Results  Component Value Date   ETH <10 10/10/2017   ETH (H) 07/02/2008    108        LOWEST DETECTABLE LIMIT FOR SERUM ALCOHOL IS 5 mg/dL FOR MEDICAL PURPOSES ONLY    Metabolic Disorder Labs: Lab Results  Component Value Date   HGBA1C 5.2 10/13/2017   MPG 103 10/13/2017   No results found for: PROLACTIN Lab Results  Component Value Date   CHOL 168 10/13/2017   TRIG 58 10/13/2017   HDL 48 10/13/2017   CHOLHDL 3.5 10/13/2017   VLDL 12 10/13/2017   LDLCALC 108 (H) 10/13/2017    Physical Findings: AIMS: Facial and Oral Movements Muscles of Facial Expression: None, normal Lips and Perioral Area: None, normal Jaw: None, normal Tongue: None, normal,Extremity Movements Upper (arms, wrists, hands, fingers): None, normal Lower (legs, knees, ankles, toes): None, normal, Trunk Movements Neck, shoulders, hips: None, normal, Overall Severity Severity of abnormal movements (highest score from questions above): None, normal Incapacitation due to abnormal movements: None, normal Patient'Byrd awareness of abnormal movements (rate only patient'Byrd report): No Awareness, Dental Status Current problems with teeth and/or dentures?: No Does patient usually wear dentures?: No  CIWA:  CIWA-Ar Total: 2 COWS:  COWS Total Score: 2  Musculoskeletal: Strength & Muscle Tone: within normal limits Gait & Station: normal Patient leans: N/A  Psychiatric Specialty Exam: Physical Exam  Nursing note and vitals reviewed. Constitutional: She is oriented to person, place, and time. She appears well-developed and well-nourished.  HENT:  Head: Normocephalic.  Neck: Normal range of  motion.  Respiratory: Effort normal.  Musculoskeletal: Normal range of motion.  Neurological: She is alert and oriented to person, place, and time.  Skin: Skin is warm.  Psychiatric: Her speech is normal. Judgment normal. Her affect is blunt. She is slowed. Thought content is paranoid. Cognition and memory are impaired.    Review of Systems  Constitutional: Negative.   HENT: Negative.   Eyes: Negative.   Respiratory: Negative.   Cardiovascular: Negative.   Gastrointestinal: Negative.   Genitourinary: Negative.   Musculoskeletal: Negative.   Skin: Negative.   Neurological: Negative.   Endo/Heme/Allergies: Negative.   Psychiatric/Behavioral: Negative for suicidal ideas.       Paranoia  All other systems reviewed and are negative.   Blood pressure 102/69, pulse (!) 128, temperature 98.8 F (37.1 C), temperature source Oral, resp. rate 16, height 5\' 4"  (1.626 m), weight 86 kg, SpO2 99 %.Body mass index is 32.53 kg/m.  General Appearance: Casual and Fairly Groomed  Eye Contact:  Good  Speech:  Clear and Coherent and Normal Rate  Volume:  Normal  Mood:  Paranoid   Affect:  Congruent and Flat  Thought Process:  Goal Directed and Descriptions of Associations: Intact  Orientation:  Full (Time, Place, and Person)  Thought Content:  WDL  Suicidal Thoughts:  No  Homicidal Thoughts:  No  Memory:  Immediate;   Good Recent;   Good Remote;   Good  Judgement:  Fair  Insight:  Fair  Psychomotor Activity:  Normal  Concentration:  Concentration: Good and Attention Span: Good  Recall:  Good  Fund of Knowledge:  Fair  Language:  Good  Akathisia:  No  Handed:  Right  AIMS (if indicated):     Assets:  Desire for Improvement Financial Resources/Insurance Social Support  ADL'Byrd:  Intact  Cognition:  WNL  Sleep:  Number of Hours: 6.75   Treatment Plan  Summary: Daily contact with patient to assess and evaluate symptoms and progress in treatment, Medication management and Plan is  to:  - Continue inpatient hospitalization.  - Will continue today8/18/2019plan as below except where it is noted.  Problems addressed:  Schizoaffective, bipolar type: -ContinueLexapro 10 mg daily for depression -ContinueInvega Sustenna 156 mg IM every 28 days for mood stabilization -Continue Agitation protocol as needed for psychosis or agitation  Anxiety -Continue Vistaril 25 mg po TID PRN as needed for anxiety  Insomnia: --Continue Trazodone 50 mg at bedtime PRN  Hypokalemia --Continue potassium 20 mEq BID  Tachycardia --Start propranolol 10 mg BID  --Encourage fluids and started I/Os  Patient to continue to participate in group counseling session  Discharge planning ongoing   Nanine Means, NP 10/20/2017, 11:19 AM

## 2017-10-21 MED ORDER — PROPRANOLOL HCL 10 MG PO TABS
10.0000 mg | ORAL_TABLET | Freq: Two times a day (BID) | ORAL | 0 refills | Status: DC
Start: 2017-10-21 — End: 2022-03-28

## 2017-10-21 MED ORDER — TRAZODONE HCL 50 MG PO TABS: 50.0000 mg | ORAL_TABLET | Freq: Every evening | ORAL | 0 refills | Status: DC | PRN

## 2017-10-21 MED ORDER — ESCITALOPRAM OXALATE 10 MG PO TABS: 10.0000 mg | ORAL_TABLET | Freq: Every day | ORAL | 0 refills | Status: DC

## 2017-10-21 NOTE — Progress Notes (Signed)
Recreation Therapy Notes  Date: 8.19.19 Time: 1000 Location: 500 [address] Dayroom  Group Topic: Coping Skills  Goal Area(s) Addresses:  Patient will identify positive coping skills. Patient will identify benefits of coping skills. Patient will identify benefits of using coping skills post d/c.  Behavioral Response: Engaged  Intervention:  Worksheet  Activity:  Mind map.  Patients were given a mind map.  LRT and patients filled in the first boxes together (misjudging, depression, anxiety, anger, being overwhelmed, loneliness, disturbing thoughts, and not listening).  Patients were to then identify at least three coping skills for each situation before the group reconvened.    Education: PharmacologistCoping Skills, Building control surveyorDischarge Planning.   Education Outcome: Acknowledges understanding/In group clarification offered/Needs additional education.   Clinical Observations/Feedback: Pt was attentive and engaged during group.  Pt identified some of her coping skills as laugh, sing, dance, take a brisk walk, ask for help, plan an outing with friends/family, change thoughts, journal and observe thoughts, start listening, ask for constructive criticism and stop being judgmental.    Caroll RancherMarjette Merita Hawks, LRT/CTRS     Caroll RancherLindsay, Syrianna Schillaci A 10/21/2017 1:52 PM

## 2017-10-21 NOTE — Progress Notes (Signed)
  [facility] :  Will you be returning to the same living situation after discharge:  Yes,  home with mother At discharge, do you have transportation home?: Yes,  mother Do you have the ability to pay for your medications: Yes,  mental health  Release of information consent forms completed and in the chart;  Patient's signature needed at discharge.  Patient to Follow up at: Follow-up Information    [facility] Follow up on 10/24/2017.   Why:  Thursday at 8:30 with Tresa EndoKelly for your hopsital d/c appointment.  Bring your ID and your hospital d/c paperwork Contact information: [address] [place] [address] [phone]           Next level of care provider has [facility] to [facility] Link:no  Safety Planning and Suicide Prevention discussed: Yes,  yes  Have you used any form of tobacco in the last 30 days? (Cigarettes, Smokeless Tobacco, Cigars, and/or Pipes): No  Has patient been referred to the Quitline?: N/A patient is not a smoker  Patient has been referred for addiction treatment: N/A  Ida RogueRodney B Augustine Leverette, LCSW 10/21/2017, 10:27 AM

## 2017-10-21 NOTE — Progress Notes (Signed)
D: Pt opened her door and peered out. Pt asked about people outside her door. Writer explained that nobody was at her door but that when people speak their voices tend to carry. Informed that no insulation, so voices can be easily heard. Pt informed the Clinical research associatewriter that she regrets coming to [facility]. Stated, " wish I never called EMS". Stated, "I just don't want to lose my job". Pt doesn't want her employer to know she's at [facility]. Instead request they only be [facility].. Pt has no other questions or concerns.    A:  Support and encouragement was offered. 15 min checks continued for safety.  R: Pt remains safe.
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
## 2017-10-21 NOTE — [facility] Suicide Risk Assessment (Signed)
[facility]   Principal Problem: Schizoaffective disorder, bipolar type [facility](HCC) Discharge Diagnoses:  Patient Active Problem List   Diagnosis Date Noted  . Posttraumatic stress disorder [F43.10] 07/28/2012  . Schizoaffective disorder, bipolar type (HCC) [F25.0] 07/28/2012    Total Time spent with patient: 30 minutes  Musculoskeletal: Strength & Muscle Tone: within normal limits Gait & Station: normal Patient leans: N/A  Psychiatric Specialty Exam: Review of Systems  Constitutional: Negative for chills and fever.  Respiratory: Negative for cough and shortness of breath.   Cardiovascular: Negative for chest pain.  Gastrointestinal: Negative for abdominal pain, heartburn, nausea and vomiting.  Psychiatric/Behavioral: Negative for depression, hallucinations and suicidal ideas. The patient is not nervous/anxious and does not have insomnia.     Blood pressure 105/81, pulse (!) 113, temperature 98.3 F (36.8 C), temperature source Oral, resp. rate 18, height 5\' 4"  (1.626 m), weight 86 kg, SpO2 99 %.Body mass index is 32.53 kg/m.  General Appearance: Casual and Fairly Groomed  Patent attorneyye Contact::  Good  Speech:  Clear and Coherent and Normal Rate  Volume:  Normal  Mood:  Euthymic  Affect:  Appropriate and Congruent  Thought Process:  Coherent and Goal Directed  Orientation:  Full (Time, Place, and Person)  Thought Content:  Logical  Suicidal Thoughts:  No  Homicidal Thoughts:  No  Memory:  Immediate;   Fair Recent;   Fair Remote;   Fair  Judgement:  Fair  Insight:  Fair  Psychomotor Activity:  Normal  Concentration:  Good  Recall:  Good  Fund of Knowledge:Good  Language: Good  Akathisia:  No  Handed:    AIMS (if indicated):     Assets:  Resilience Social Support  Sleep:  Number of Hours: 5.25  Cognition: WNL  ADL's:  Intact   Mental Status Per Nursing Assessment::   On Admission:  NA  Demographic Factors:  Adolescent or young adult and Low  socioeconomic status  Loss Factors: Financial problems/change in socioeconomic status  Historical Factors: Impulsivity  Risk Reduction Factors:   Responsible for children under 30 years of age, Sense of responsibility to family, Employed, Living with another person, especially a relative, Positive social support, Positive therapeutic relationship and Positive coping skills or problem solving skills  Continued Clinical Symptoms:  Severe Anxiety and/or Agitation Bipolar Disorder:   Depressive phase  Cognitive Features That Contribute To Risk:  None    Suicide Risk:  Minimal: No identifiable suicidal ideation.  Patients presenting with no risk factors but with morbid ruminations; may be classified as minimal risk based on the severity of the depressive symptoms  Follow-up Information    [facility] Follow up.   Contact information: [address] [place] [address] [phone]         Subjective Data:  Monica Byrd is a 32 y/o F with history of schizoaffective bipolar type and PTSD who was admitted on IVC with worsening disorganization, paranoia, and intrusive thoughts.  As per intake SRA: 32 year old female, employed, has an 374 year old child, who is currently with the father. Patient was brought to ED via EMS . Originally EMS had been contacted for patient's son, but as per notes EMS deemed that child was OK, doing well, but that patient seemed confused , psychotic, internally preoccupied , reporting she felt she had been abducted. On admission to ED patient seemed fearful, confused and reported she felt that everybody could hear her, and that " something is coming out of my vagina  that starts in my stomach". She denies alcohol or dug abuse- admission BAL negative, admission UDS positive for Cannabis Today patient presents as fair historian, appears depressed, vaguely anxious, guarded. States she felt " like everything was crumbling around me", but does not elaborate  further. Endorses some sadness, anhedonia, poor sleep prior to admission. Today denies hallucinations. Patient endorses history of depression. As per chart patient has history of a prior psychiatric admission in May 2014 for depression, psychosis, at the time was diagnosed with Schizoaffective Disorder and PTSD, discharged on Prozac and Risperidone . She denies history of suicidal attempts . Prior to this admission she was prescribed Buspar and Lexapro- at this time it is unclear if she was compliant with medication regimen.  Today upon evaluation: Pt shares, "I'm good. I'm ready to go today. I'm supposed to start this job today, and I'm feeling better. I'm not worried about germs any more." Pt reports she is feeling "better" overall compared to her initial presenting symptoms. She does not feel overwhelmed with anxiety or preoccupied with contamination. She is sleeping well. Her appetite is good. She denies other physical complaints. She denies SI/HI/AH/VH. She is tolerating her medications well, and she is in agreement to continue her current regimen without changes, and she plans to follow up with her outpatient provider to continue further injections. She was able to engage in safety planning including plan [facility] or contact emergency services if she feels unable to maintain her own safety or the safety of others. Pt had no further questions, comments, or concerns.   Plan Of Care/Follow-up recommendations:   -Discharge to outpatient level of care  -Schizoaffective disorder, bipolar type and PTSD -Continue Lexapro 10 mg po Q daily.  - Continue Gean Birchwoodnvega Sustenna 156mg  IM q28 days (last given on 8/16. Hinda GlatterInvega Sustenna 234mg  IM given once on8/13).   -Anxiety -Continue Vistaril 25mg  po q8h prn anxiety  -Insomnia -Continue Trazodone 100 mg po qhs prn insomnia  Activity:  as tolerated Diet:  normal Tests:  NA Other:  see  above for DC plan  Micheal Likenshristopher T Kendallyn Lippold, MD 10/21/2017, 10:12 AM

## 2019-10-10 ENCOUNTER — Encounter (HOSPITAL_COMMUNITY): Payer: Self-pay | Admitting: Emergency Medicine

## 2019-10-10 ENCOUNTER — Emergency Department (HOSPITAL_COMMUNITY)
Admission: EM | Admit: 2019-10-10 | Discharge: 2019-10-11 | Disposition: A | Discharge status: Home / Self Care | Payer: 59 | Attending: Emergency Medicine | Admitting: Emergency Medicine

## 2019-10-10 ENCOUNTER — Other Ambulatory Visit: Payer: Self-pay

## 2019-10-10 DIAGNOSIS — I10 Essential (primary) hypertension: Secondary | ICD-10-CM | POA: Insufficient documentation

## 2019-10-10 DIAGNOSIS — Z5321 Procedure and treatment not carried out due to patient leaving prior to being seen by health care provider: Secondary | ICD-10-CM | POA: Insufficient documentation

## 2019-10-10 NOTE — ED Triage Notes (Signed)
Pt c/o hypertension today. Pt states she had a slight headache earlier and that it feels like her ears are clogged. BP in triage 152/103.

## 2019-10-11 NOTE — ED Triage Notes (Signed)
Pt notified registration at 12:28 that she was leaving.

## 2019-12-12 ENCOUNTER — Other Ambulatory Visit: Payer: Self-pay

## 2019-12-12 ENCOUNTER — Emergency Department (HOSPITAL_COMMUNITY)
Admission: EM | Admit: 2019-12-12 | Discharge: 2019-12-13 | Disposition: A | Discharge status: Home / Self Care | Payer: 59 | Attending: Emergency Medicine | Admitting: Emergency Medicine

## 2019-12-12 ENCOUNTER — Encounter (HOSPITAL_COMMUNITY): Payer: Self-pay | Admitting: Emergency Medicine

## 2019-12-12 DIAGNOSIS — Z20822 Contact with and (suspected) exposure to covid-19: Secondary | ICD-10-CM | POA: Diagnosis not present

## 2019-12-12 DIAGNOSIS — F1010 Alcohol abuse, uncomplicated: Secondary | ICD-10-CM | POA: Insufficient documentation

## 2019-12-12 DIAGNOSIS — I10 Essential (primary) hypertension: Secondary | ICD-10-CM | POA: Insufficient documentation

## 2019-12-12 DIAGNOSIS — Z79899 Other long term (current) drug therapy: Secondary | ICD-10-CM | POA: Diagnosis not present

## 2019-12-12 DIAGNOSIS — F29 Unspecified psychosis not due to a substance or known physiological condition: Secondary | ICD-10-CM | POA: Insufficient documentation

## 2019-12-12 MED ORDER — PROPRANOLOL HCL 10 MG PO TABS
10.0000 mg | ORAL_TABLET | Freq: Two times a day (BID) | ORAL | Status: DC
  Administered 2019-12-13: 10 mg via ORAL
  Filled 2019-12-12: qty 1

## 2019-12-12 MED ORDER — ESCITALOPRAM OXALATE 10 MG PO TABS
10.0000 mg | ORAL_TABLET | Freq: Every day | ORAL | Status: DC
  Administered 2019-12-13: 10 mg via ORAL
  Filled 2019-12-12: qty 1

## 2019-12-12 MED ORDER — TRAZODONE HCL 50 MG PO TABS: 50.0000 mg | ORAL_TABLET | Freq: Every evening | ORAL | Status: DC | PRN

## 2019-12-12 NOTE — ED Notes (Signed)
Urine sample was given. Patient dressed out in purple scrubs. Belongings were put in locker.
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
## 2019-12-13 LAB — RAPID URINE DRUG SCREEN, HOSP PERFORMED
Amphetamines: NOT DETECTED
Barbiturates: NOT DETECTED
Benzodiazepines: NOT DETECTED
Cocaine: NOT DETECTED
Opiates: NOT DETECTED
Tetrahydrocannabinol: NOT DETECTED

## 2019-12-13 LAB — COMPREHENSIVE METABOLIC PANEL
ALT: 13 U/L (ref 0–44)
AST: 18 U/L (ref 15–41)
Albumin: 4.4 g/dL (ref 3.5–5.0)
Alkaline Phosphatase: 61 U/L (ref 38–126)
Anion gap: 12 (ref 5–15)
BUN: 6 mg/dL (ref 6–20)
CO2: 23 mmol/L (ref 22–32)
Calcium: 9.1 mg/dL (ref 8.9–10.3)
Chloride: 107 mmol/L (ref 98–111)
Creatinine, Ser: 0.84 mg/dL (ref 0.44–1.00)
GFR, Estimated: >60 mL/min (ref >60)
Glucose, Bld: 96 mg/dL (ref 70–99)
Potassium: 3.6 mmol/L (ref 3.5–5.1)
Sodium: 142 mmol/L (ref 135–145)
Total Bilirubin: 0.5 mg/dL (ref 0.3–1.2)
Total Protein: 7.9 g/dL (ref 6.5–8.1)

## 2019-12-13 LAB — CBC WITH DIFFERENTIAL/PLATELET
Abs Immature Granulocytes: 0.01 10*3/uL (ref 0.00–0.07)
Basophils Absolute: 0 10*3/uL (ref 0.0–0.1)
Basophils Relative: 0 %
Eosinophils Absolute: 0 10*3/uL (ref 0.0–0.5)
Eosinophils Relative: 0 %
HCT: 37.5 % (ref 36.0–46.0)
Hemoglobin: 12.8 g/dL (ref 12.0–15.0)
Immature Granulocytes: 0 %
Lymphocytes Relative: 29 %
Lymphs Abs: 2 10*3/uL (ref 0.7–4.0)
MCH: 32 pg (ref 26.0–34.0)
MCHC: 34.1 g/dL (ref 30.0–36.0)
MCV: 93.8 fL (ref 80.0–100.0)
Monocytes Absolute: 0.5 10*3/uL (ref 0.1–1.0)
Monocytes Relative: 7 %
Neutro Abs: 4.3 10*3/uL (ref 1.7–7.7)
Neutrophils Relative %: 64 %
Platelets: 285 10*3/uL (ref 150–400)
RBC: 4 MIL/uL (ref 3.87–5.11)
RDW: 12.8 % (ref 11.5–15.5)
WBC: 6.8 10*3/uL (ref 4.0–10.5)
nRBC: 0 % (ref 0.0–0.2)

## 2019-12-13 LAB — RESPIRATORY PANEL BY RT PCR (FLU A&B, COVID)
Influenza A by PCR: NEGATIVE
Influenza B by PCR: NEGATIVE
SARS Coronavirus 2 by RT PCR: NEGATIVE

## 2019-12-13 LAB — POC URINE PREG, ED: Preg Test, Ur: NEGATIVE

## 2019-12-13 LAB — ETHANOL: Alcohol, Ethyl (B): 137 mg/dL — ABNORMAL HIGH (ref <10)

## 2019-12-13 MED ORDER — ACETAMINOPHEN 325 MG PO TABS
650.0000 mg | ORAL_TABLET | Freq: Four times a day (QID) | ORAL | Status: DC | PRN
  Administered 2019-12-13: 650 mg via ORAL
  Filled 2019-12-13: qty 2
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
## 2019-12-13 NOTE — ED Notes (Signed)
Assumed care of pt at 0700 

## 2019-12-13 NOTE — Discharge Instructions (Addendum)
Cleared by behavioral health for discharge home.  Follow-up as per behavioral health. 

## 2019-12-13 NOTE — ED Provider Notes (Signed)
Cleared by behavioral health for discharge home.  IVC will be rescinded.   Vanetta Mulders, MD 12/13/19 [phone]

## 2019-12-13 NOTE — ED Notes (Addendum)
Assessment completed Nira Conn, NP, recommends overnight observation for safety and stabilization with psych reassessment in the AM. Patient under IVC, not appropriate for [facility].
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
## 2019-12-13 NOTE — ED Notes (Signed)
IVC paper worked rescinded  faxed to [employer]
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
## 2019-12-23 ENCOUNTER — Ambulatory Visit: Payer: 59 | Admitting: Family Medicine

## 2020-03-09 ENCOUNTER — Ambulatory Visit: Payer: 59 | Admitting: Nurse Practitioner

## 2020-03-21 ENCOUNTER — Encounter: Payer: Self-pay | Admitting: Nurse Practitioner

## 2020-03-21 ENCOUNTER — Telehealth: Payer: Self-pay | Admitting: Nurse Practitioner
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
## 2020-05-27 ENCOUNTER — Ambulatory Visit: Payer: Self-pay | Admitting: Family Medicine

## 2020-09-09 ENCOUNTER — Ambulatory Visit: Payer: Self-pay | Admitting: Family Medicine

## 2021-04-24 ENCOUNTER — Emergency Department (HOSPITAL_COMMUNITY)
Admission: EM | Admit: 2021-04-24 | Discharge: 2021-04-25 | Disposition: A | Discharge status: Home / Self Care | Payer: Self-pay | Attending: Emergency Medicine | Admitting: Emergency Medicine

## 2021-04-24 ENCOUNTER — Encounter (HOSPITAL_COMMUNITY): Payer: Self-pay | Admitting: Emergency Medicine

## 2021-04-24 ENCOUNTER — Other Ambulatory Visit: Payer: Self-pay

## 2021-04-24 ENCOUNTER — Emergency Department (HOSPITAL_COMMUNITY): Payer: Self-pay

## 2021-04-24 DIAGNOSIS — Z79899 Other long term (current) drug therapy: Secondary | ICD-10-CM | POA: Insufficient documentation

## 2021-04-24 DIAGNOSIS — F10120 Alcohol abuse with intoxication, uncomplicated: Secondary | ICD-10-CM | POA: Insufficient documentation

## 2021-04-24 DIAGNOSIS — Y907 Blood alcohol level of 200-239 mg/100 ml: Secondary | ICD-10-CM | POA: Insufficient documentation

## 2021-04-24 DIAGNOSIS — Y92481 Parking lot as the place of occurrence of the external cause: Secondary | ICD-10-CM | POA: Insufficient documentation

## 2021-04-24 DIAGNOSIS — F10920 Alcohol use, unspecified with intoxication, uncomplicated: Secondary | ICD-10-CM

## 2021-04-24 DIAGNOSIS — S0083XA Contusion of other part of head, initial encounter: Secondary | ICD-10-CM | POA: Insufficient documentation

## 2021-04-24 DIAGNOSIS — I10 Essential (primary) hypertension: Secondary | ICD-10-CM | POA: Insufficient documentation
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
## 2021-04-24 NOTE — ED Notes (Signed)
Pt undressed, white shorts and black top placed in brown paper bag labeled at bedside.
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
## 2021-04-25 LAB — ETHANOL: Alcohol, Ethyl (B): 231 mg/dL — ABNORMAL HIGH (ref <10)
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
## 2021-04-27 ENCOUNTER — Other Ambulatory Visit: Payer: Self-pay

## 2021-04-27 DIAGNOSIS — T7621XA Adult sexual abuse, suspected, initial encounter: Secondary | ICD-10-CM | POA: Insufficient documentation

## 2021-04-27 DIAGNOSIS — I10 Essential (primary) hypertension: Secondary | ICD-10-CM | POA: Insufficient documentation
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
## 2021-04-28 ENCOUNTER — Emergency Department (HOSPITAL_COMMUNITY)
Admission: EM | Admit: 2021-04-28 | Discharge: 2021-04-28 | Disposition: A | Discharge status: Home / Self Care | Payer: Self-pay | Attending: Emergency Medicine | Admitting: Emergency Medicine

## 2021-04-28 NOTE — ED Notes (Signed)
Pt left ED after talking with MD and Charge nurse AMA.
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
## 2022-03-05 NOTE — L&D Delivery Note (Addendum)
Delivery Note WILLY RAK is a 37 y.o. [id number] at [redacted]w[redacted]d admitted for active labor.   GBS Status:  Negative/-- (05/10 1637)  Labor course: Initial SVE: 10//0. Augmentation with: N/A. She then progressed to complete.  ROM: 1h 70m with clear fluid  Birth: Delivery of a Live born female  Birth Weight: 7 lb 3.3 oz (3270 g) APGAR: 7, 9  Newborn Delivery   Birth date/time: 07/24/2022 11:08:00 Delivery type: Vaginal, Spontaneous         Delivered via spontaneous vaginal delivery (Presentation: LOA ). Nuchal cord present: Yes.  Shoulders and body delivered in usual fashion. Infant placed directly on mom's abdomen for bonding/skin-to-skin, baby dried and stimulated. Cord clamped x 2 after 1 minute and cut by FCD.  Cord blood collected. Placenta delivered-Spontaneous  with 3 vessels . 20u Pitocin in 500cc LR given as a bolus prior delivery of placenta.  Fundus firm with massage. Placenta inspected and appears to be intact with a 3 VC.  Sponge and instrument count were correct x2.  Intrapartum complications:  None Anesthesia:  local Lacerations:  2nd degree and periurethral Suture Repair: 3.0 vicryl 4.0 monocryl EBL (mL):340    Mom to postpartum.  Baby to Couplet care / Skin to Skin. Placenta to L&D   Plans to Breastfeed Contraception: condoms Circumcision: wants inpatient  Note sent to [facility]:  [facility]  for pp visit.  Delivery Report:  Review the Delivery Report for details.     Signed: Karis Juba, SNM 07/24/2022, 12:19 PM

## 2022-03-28 ENCOUNTER — Ambulatory Visit (INDEPENDENT_AMBULATORY_CARE_PROVIDER_SITE_OTHER): Payer: Managed Care, Other (non HMO) | Admitting: Student

## 2022-03-28 ENCOUNTER — Encounter: Payer: Self-pay | Admitting: Student

## 2022-03-28 VITALS — BP 112/69 | HR 87 | Ht 64.0 in

## 2022-03-28 DIAGNOSIS — Z13228 Encounter for screening for other metabolic disorders: Secondary | ICD-10-CM

## 2022-03-28 DIAGNOSIS — Z3492 Encounter for supervision of normal pregnancy, unspecified, second trimester: Secondary | ICD-10-CM | POA: Insufficient documentation

## 2022-03-28 DIAGNOSIS — Z3A23 23 weeks gestation of pregnancy: Secondary | ICD-10-CM

## 2022-03-28 LAB — POCT URINE PREGNANCY: Preg Test, Ur: POSITIVE — AB

## 2022-03-28 NOTE — Progress Notes (Signed)
New Patient Office Visit  Subjective    Patient ID: MAHINA SALATINO, female    DOB: 08-30-1985  Age: 37 y.o. MRN: [id number]  CC:  Chief Complaint  Patient presents with   Establish Care    HPI ROSELENE GRAY presents to establish care and establish OB care  LMP 13th of August would be at 23w 3d. No birth control prior to this. Some cramps.. Has had two ultrasounds at [facility] which have confirmed IUP and had no issues. Got one at 9 weeks and and had another one 2-3 weeks ago-confirmed it was a boy. Denies any vaginal bleeding or fluid leaking, no contractions. Had a SVD previously 12 years ago with no complications.   PMH-schizoaffective, bipolar disorder; PTSD, anxiety Surgical-None FH-pancreatic cancer maternal grandfather Allergies-seasonal allergies Social- lives with son-split custody, tobacco use in past very occasionally, no drug use in the past, 2 drinks on the weekend hasn't drank in 6 months  Outpatient Encounter Medications as of 03/28/2022  Medication Sig   [DISCONTINUED] escitalopram (LEXAPRO) 10 MG tablet Take 1 tablet (10 mg total) by mouth daily.   [DISCONTINUED] propranolol (INDERAL) 10 MG tablet Take 1 tablet (10 mg total) by mouth 2 (two) times daily.   [DISCONTINUED] traZODone (DESYREL) 50 MG tablet Take 1 tablet (50 mg total) by mouth at bedtime as needed for sleep.   No facility-administered encounter medications on file as of 03/28/2022.    Past Medical History:  Diagnosis Date   Anxiety    Depression    Hypertension     No past surgical history on file.  Family History  Problem Relation Age of Onset   Pancreatic cancer Maternal Grandfather     Social History   Socioeconomic History   Marital status: Single    Spouse name: Not on file   Number of children: Not on file   Years of education: Not on file   Highest education level: Not on file  Occupational History   Not on file  Tobacco Use   Smoking status: Never    Smokeless tobacco: Never  Vaping Use   Vaping Use: Never used  Substance and Sexual Activity   Alcohol use: Yes    Comment: 1-2 drinks occasionally    Drug use: Not Currently    Types: Marijuana    Comment: pt currently denies as of 10/10/17   Sexual activity: Not on file  Other Topics Concern   Not on file  Social History Narrative   Not on file   Social Determinants of Health   Financial Resource Strain: Not on file  Food Insecurity: Not on file  Transportation Needs: Not on file  Physical Activity: Not on file  Stress: Not on file  Social Connections: Not on file  Intimate Partner Violence: Not on file   Objective    BP 112/69   Pulse 87   Ht 5\' 4"  (1.626 m)   LMP 04/27/2021   BMI 30.04 kg/m   General: Well appearing, NAD, awake, alert, responsive to questions Head: Normocephalic atraumatic CV: Regular rate and rhythm no murmurs rubs or gallops Respiratory: Clear to ausculation bilaterally, no wheezes rales or crackles, chest rises symmetrically,  no increased work of breathing Abdomen: Gravid uterus measuring at 37, FHT 146 Extremities: Moves upper and lower extremities freely, no edema in LE Neuro: No focal deficits Skin: No rashes or lesions visualized   Assessment & Plan:   Problem List Items Addressed This Visit  Other   [redacted] weeks gestation of pregnancy    FHT 146 in office, measuring at 51.  Discussed with patient to continue prenatal vitamin.  I also discussed to send over ultrasound results. -Needs close follow-up with initial OB visit within this week or next week.      Relevant Orders   CBC/D/Plt+RPR+Rh+ABO+RubIgG...   POCT urine pregnancy (Completed)   Other Visit Diagnoses     Encounter for screening for other metabolic disorders    -  Primary   Relevant Orders   Hepatitis C antibody (reflex, frozen specimen)       No follow-ups on file.   Gerrit Heck, MD

## 2022-03-28 NOTE — Assessment & Plan Note (Signed)
FHT 146 in office, measuring at 23.  Discussed with patient to continue prenatal vitamin.  I also discussed to send over ultrasound results. -Needs close follow-up with initial OB visit within this week or next week.

## 2022-03-28 NOTE — Patient Instructions (Addendum)
It was great to see you! Thank you for allowing me to participate in your care!   I recommend that you always bring your medications to each appointment as this makes it easy to ensure we are on the correct medications and helps [place] not miss when refills are needed.  Our plans for today:  - Please schedule a follow up to get your pap smear and initial OB visit! -If able have them send over your ultrasound results Go to the MAU at [place] at [facility] if: You have cramping/contractions that do not go away with drinking water Your water breaks.  Sometimes it is a big gush of fluid, sometimes it is just a trickle that keeps getting your underwear wet or running down your legs You have vaginal bleeding.     We are checking some labs today, I will call you if they are abnormal will send you a MyChart message or a letter if they are normal.  If you do not hear about your labs in the next 2 weeks please let us know.  Take care and seek immediate care sooner if you develop any concerns. Please remember to show up 15 minutes before your scheduled appointment time!  Gerrit Heck, MD [place]

## 2022-03-30 LAB — CBC/D/PLT+RPR+RH+ABO+RUBIGG...
Antibody Screen: NEGATIVE
Basophils Absolute: 0 10*3/uL (ref 0.0–0.2)
Basos: 0 %
Bilirubin, UA: NEGATIVE
EOS (ABSOLUTE): 0 10*3/uL (ref 0.0–0.4)
Eos: 0 %
Glucose, UA: NEGATIVE
HCV Ab: NONREACTIVE
HIV Screen 4th Generation wRfx: NONREACTIVE
Hematocrit: 32.4 % — ABNORMAL LOW (ref 34.0–46.6)
Hemoglobin: 11 g/dL — ABNORMAL LOW (ref 11.1–15.9)
Hepatitis B Surface Ag: NEGATIVE
Immature Grans (Abs): 0 10*3/uL (ref 0.0–0.1)
Immature Granulocytes: 0 %
Ketones, UA: NEGATIVE
Leukocytes,UA: NEGATIVE
Lymphocytes Absolute: 1.2 10*3/uL (ref 0.7–3.1)
Lymphs: 18 %
MCH: 31.5 pg (ref 26.6–33.0)
MCHC: 34 g/dL (ref 31.5–35.7)
MCV: 93 fL (ref 79–97)
Monocytes Absolute: 1 10*3/uL — ABNORMAL HIGH (ref 0.1–0.9)
Monocytes: 14 %
Neutrophils Absolute: 4.5 10*3/uL (ref 1.4–7.0)
Neutrophils: 68 %
Nitrite, UA: NEGATIVE
Platelets: 244 10*3/uL (ref 150–450)
Protein,UA: NEGATIVE
RBC, UA: NEGATIVE
RBC: 3.49 x10E6/uL — ABNORMAL LOW (ref 3.77–5.28)
RDW: 11.5 % — ABNORMAL LOW (ref 11.7–15.4)
RPR Ser Ql: NONREACTIVE
Rh Factor: POSITIVE
Rubella Antibodies, IGG: 5.13 index (ref >0.99)
Specific Gravity, UA: 1.025 (ref 1.005–1.030)
Urobilinogen, Ur: 0.2 mg/dL (ref 0.2–1.0)
WBC: 6.7 10*3/uL (ref 3.4–10.8)
pH, UA: 6 (ref 5.0–7.5)

## 2022-03-30 LAB — MICROSCOPIC EXAMINATION
Casts: NONE SEEN /lpf
Epithelial Cells (non renal): >10 /hpf — AB (ref 0–10)

## 2022-03-30 LAB — URINE CULTURE, OB REFLEX

## 2022-03-30 LAB — HCV INTERPRETATION

## 2022-04-15 NOTE — Progress Notes (Signed)
Patient Name: Monica Byrd Date of Birth: May 09, 1985 [place] Initial Prenatal Visit  Monica Byrd is a 37 y.o. year old G1P0 at Unknown who presents for her initial prenatal visit. Pregnancy {Is/is not:9024} planned She reports {pregnancy symptoms:18128}. She {is/is not:320031::"is"} taking a prenatal vitamin.  She denies pelvic pain or vaginal bleeding.   Pregnancy Dating: The patient is dated by ***.  LMP: *** Period is certain:  0000000.  Periods were regular:  {yes/no:20286}.  LMP was a typical period:  {yes/no:20286}.  Using hormonal contraception in 3 months prior to conception: {yes/no:20286}  Lab Review: Blood type: O Rh Status: + Antibody screen: Negative HIV: Negative RPR: Negative Hemoglobin electrophoresis reviewed: {yes/no:20286::"Yes"} Results of OB urine culture are: Negative (10,000 of urogenital flora) Rubella: Immune Hep C Ab: Negative Varicella status is {Desc; immune/not/unknown:31571::"Immune"}  PMH: Reviewed and as detailed below: HTN: {yes/no:20286::"No"}  Gestational Hypertension/preeclampsia: {yes/no:20286::"No"}  Type 1 or 2 Diabetes: {yes/no:20286::"No"}  Depression:  {yes/no:20286::"No"}  Seizure disorder:  {yes/no:20286::"No"} VTE: {yes/no:20286::"No"} ,  History of STI {yes/no:20286::"No"},  Abnormal Pap smear:  {yes/no:20286::"No"}, Genital herpes simplex:  {yes/no:20286::"No"}   PSH: Gynecologic Surgery:  {No/  **:31982:o:"no"} Surgical history reviewed, notable for: ***  Obstetric History: Obstetric history tab updated and reviewed.  Summary of prior pregnancies: *** Cesarean delivery: {yes/no:20286::"No"}  Gestational Diabetes:  {yes/no:20286::"No"} Hypertension in pregnancy: {yes/no:20286::"No"} History of preterm birth: {yes/no:20286::"No"} History of LGA/SGA infant:  {yes/no:20286::"No"} History of shoulder dystocia: {yes/no:20286::"No"} Indications for referral were reviewed, and the  patient has no obstetric indications for referral to [facility] at this time.   Social History: Partner's name: ***  Tobacco use: {yes/no:20286::"No"} Alcohol use:  {yes/no:20286::"No"} Other substance use:  {yes/no:20286::"No"}  Current Medications:  ***  Reviewed and appropriate in pregnancy.   Genetic and Infection Screen: Flow Sheet Updated {yes/no:20286::"Yes"}  Prenatal Exam: Gen: Well nourished, well developed.  No distress.  Vitals noted. HEENT: Normocephalic, atraumatic.  Neck supple without cervical lymphadenopathy, thyromegaly or thyroid nodules.  Fair dentition. CV: RRR no murmur, gallops or rubs Lungs: CTA B.  Normal respiratory effort without wheezes or rales. Abd: soft, NTND. +BS.  Uterus not appreciated above pelvis. GU: Normal external female genitalia without lesions.  Nl vaginal, well rugated without lesions. No vaginal discharge.  Bimanual exam: No adnexal mass or TTP. No CMT.  Uterus size *** Ext: No clubbing, cyanosis or edema. Psych: Normal grooming and dress.  Not depressed or anxious appearing.  Normal thought content and process without flight of ideas or looseness of associations  Fetal heart tones: {appropriate:23337::"Appropriate"}  Assessment/Plan:  Monica Byrd is a 37 y.o. G1P0 at Unknown who presents to initiate prenatal care. She is doing well.  Current pregnancy issues include ***.  Routine prenatal care: As dating {ACTION; IS/IS NOT:21021397::"is not"} reliable, a dating ultrasound {HAS HAS NOT:18834::"has"} been ordered. Dating tab updated. Pre-pregnancy weight updated. Expected weight gain this pregnancy is {weight gain pregnancy :23296::"25-35 pounds "} Prenatal labs reviewed, notable for ***. Indications for referral to [facility] were reviewed and the patient {DOES NOT does:27190::"does not"} meet criteria for referral.  Medication list reviewed and updated.  Recommended patient see a dentist for regular care.  Bleeding and pain  precautions reviewed. Importance of prenatal vitamins reviewed.  Genetic screening offered. Patient opted for: {obgeneticscreen:23414}. The patient has the following indications for aspirinto begin 81 mg at 12-16 weeks: One high risk condition: {fmcaspirinobhigh:26167} MORE than one moderate risk condition: {fmcaspirinobmoderate:26168} Aspirin {WAS/WAS [phone]::"was not"}  recommended today based upon above  risk factors (one high risk condition or more than one moderate risk factor)  The patient {will/will not be:23415} age 62 or over at time of delivery. Referral to genetic counseling {WAS/WAS NOT:[phone]::"was not"} offered today.  The patient has the following risk factors for preexisting diabetes: {Pre-existing diabetes screening:23343::"Reviewed indications for early 1 hour glucose testing, not indicated "}. An early 1 hour glucose tolerance test {WAS/WAS NOT:[phone]::"was not"} ordered. Pregnancy Medical Home and PHQ-9 forms completed, problems noted: {yes/no:20286}  2. Pregnancy issues include the following which were addressed today:  ***   Follow up 4 weeks for next prenatal visit.

## 2022-04-16 ENCOUNTER — Ambulatory Visit (INDEPENDENT_AMBULATORY_CARE_PROVIDER_SITE_OTHER): Payer: Managed Care, Other (non HMO) | Admitting: Student

## 2022-04-16 ENCOUNTER — Other Ambulatory Visit (HOSPITAL_COMMUNITY)
Admission: RE | Admit: 2022-04-16 | Discharge: 2022-04-16 | Disposition: A | Discharge status: Home / Self Care | Payer: Managed Care, Other (non HMO) | Source: Ambulatory Visit | Attending: Family Medicine | Admitting: Family Medicine

## 2022-04-16 ENCOUNTER — Encounter: Payer: Self-pay | Admitting: Student

## 2022-04-16 VITALS — BP 102/68 | HR 84 | Wt 190.4 lb

## 2022-04-16 DIAGNOSIS — Z124 Encounter for screening for malignant neoplasm of cervix: Secondary | ICD-10-CM | POA: Diagnosis present

## 2022-04-16 DIAGNOSIS — Z3A26 26 weeks gestation of pregnancy: Secondary | ICD-10-CM | POA: Diagnosis not present

## 2022-04-16 MED ORDER — PRENATAL MULTIVITAMIN CH: 1.0000 | ORAL_TABLET | Freq: Every day | ORAL | 1 refills | Status: DC

## 2022-04-16 NOTE — Patient Instructions (Addendum)
It was great to see you! Thank you for allowing me to participate in your care!   Our plans for today:  - We will schedule an OB Faculty appointment for you - I am ordering some labs on you including a B12 level before with get injections to  - I would like you to get a 1 hour glucose tolerance test-you may come back fasting to the lab for this - We are getting an ultrasound for you -You have a faculty OB appointment on 2/29   Take care and seek immediate care sooner if you develop any concerns.  Gerrit Heck, MD

## 2022-04-17 LAB — COMPREHENSIVE METABOLIC PANEL
ALT: 12 IU/L (ref 0–32)
AST: 17 IU/L (ref 0–40)
Albumin/Globulin Ratio: 1.6 (ref 1.2–2.2)
Albumin: 4.2 g/dL (ref 3.9–4.9)
Alkaline Phosphatase: 82 IU/L (ref 44–121)
BUN/Creatinine Ratio: 16 (ref 9–23)
BUN: 17 mg/dL (ref 6–20)
Bilirubin Total: 0.4 mg/dL (ref 0.0–1.2)
CO2: 22 mmol/L (ref 20–29)
Calcium: 9.3 mg/dL (ref 8.7–10.2)
Chloride: 103 mmol/L (ref 96–106)
Creatinine, Ser: 1.04 mg/dL — ABNORMAL HIGH (ref 0.57–1.00)
Globulin, Total: 2.6 g/dL (ref 1.5–4.5)
Glucose: 90 mg/dL (ref 70–99)
Potassium: 4.3 mmol/L (ref 3.5–5.2)
Sodium: 139 mmol/L (ref 134–144)
Total Protein: 6.8 g/dL (ref 6.0–8.5)
eGFR: 72 mL/min/{1.73_m2} (ref >59)

## 2022-04-17 LAB — VITAMIN B12: Vitamin B-12: 663 pg/mL (ref 232–1245)

## 2022-04-19 LAB — CYTOLOGY - PAP
Chlamydia: POSITIVE — AB
Comment: NEGATIVE
Comment: NEGATIVE
Comment: NORMAL
Diagnosis: UNDETERMINED — AB
High risk HPV: NEGATIVE
Neisseria Gonorrhea: NEGATIVE

## 2022-04-20 ENCOUNTER — Ambulatory Visit (INDEPENDENT_AMBULATORY_CARE_PROVIDER_SITE_OTHER): Payer: Managed Care, Other (non HMO)

## 2022-04-20 ENCOUNTER — Telehealth: Payer: Self-pay

## 2022-04-20 ENCOUNTER — Other Ambulatory Visit: Payer: Self-pay | Admitting: Family Medicine

## 2022-04-20 ENCOUNTER — Ambulatory Visit: Payer: Self-pay

## 2022-04-20 DIAGNOSIS — A749 Chlamydial infection, unspecified: Secondary | ICD-10-CM

## 2022-04-20 MED ORDER — AZITHROMYCIN 500 MG PO TABS: 1000.0000 mg | ORAL_TABLET | Freq: Once | ORAL | 0 refills | Status: AC

## 2022-04-20 MED ORDER — ONDANSETRON 4 MG PO TBDP: ORAL_TABLET | ORAL | 0 refills | Status: DC

## 2022-04-20 MED ORDER — AZITHROMYCIN 500 MG PO TABS
1000.0000 mg | ORAL_TABLET | Freq: Once | ORAL | Status: AC
  Administered 2022-04-20: 1000 mg via ORAL

## 2022-04-20 NOTE — Telephone Encounter (Signed)
Patient calls nurse line regarding concerns with STD treatment. Patient reports vomiting approx 15 minutes after taking azithromycin pills.   Spoke with Dr. Thompson Grayer. Advised that patient receive treatment again. Also recommended that patient take zofran prior to taking pills. Patient is requesting that medications (azithromycin and zofran) be sent to [facility] on [address] and she will take them over the weekend.   Forwarding to Dr. Thompson Grayer.   Talbot Grumbling, RN

## 2022-04-20 NOTE — Progress Notes (Signed)
Patient in nurse clinic today for STD treatment of chlamydia.    Patient advised to abstain from sex for 7-10 days after treatment of self and partner.    Azithromycin 1 GM PO given per Dr. Annamary Carolin orders.   STD report form fax completed and faxed to [facility] Department at [phone] (STD department).     Received telephone call from patient after visit. See phone note. Patient will receive repeat treatment. Sent in by Dr. Thompson Grayer.     Talbot Grumbling, RN

## 2022-04-20 NOTE — Progress Notes (Signed)
1038m azithromycin and 4 mg Zofran ODT to be taken 15-30 min beforehand for treatment of chlamydia sent to patient's preferred pharmacy.  MYehuda SavannahMD

## 2022-05-02 ENCOUNTER — Ambulatory Visit: Payer: Managed Care, Other (non HMO) | Admitting: *Deleted

## 2022-05-02 ENCOUNTER — Ambulatory Visit: Payer: Managed Care, Other (non HMO) | Attending: Family Medicine

## 2022-05-02 ENCOUNTER — Other Ambulatory Visit: Payer: Self-pay | Admitting: *Deleted

## 2022-05-02 VITALS — BP 111/80 | HR 103

## 2022-05-02 DIAGNOSIS — O09523 Supervision of elderly multigravida, third trimester: Secondary | ICD-10-CM | POA: Insufficient documentation

## 2022-05-02 DIAGNOSIS — O09522 Supervision of elderly multigravida, second trimester: Secondary | ICD-10-CM

## 2022-05-02 DIAGNOSIS — O09299 Supervision of pregnancy with other poor reproductive or obstetric history, unspecified trimester: Secondary | ICD-10-CM

## 2022-05-02 DIAGNOSIS — O0933 Supervision of pregnancy with insufficient antenatal care, third trimester: Secondary | ICD-10-CM | POA: Diagnosis not present

## 2022-05-02 DIAGNOSIS — O24113 Pre-existing diabetes mellitus, type 2, in pregnancy, third trimester: Secondary | ICD-10-CM | POA: Diagnosis not present

## 2022-05-02 DIAGNOSIS — Z363 Encounter for antenatal screening for malformations: Secondary | ICD-10-CM | POA: Insufficient documentation

## 2022-05-02 DIAGNOSIS — O09213 Supervision of pregnancy with history of pre-term labor, third trimester: Secondary | ICD-10-CM | POA: Diagnosis not present

## 2022-05-02 DIAGNOSIS — Z3A26 26 weeks gestation of pregnancy: Secondary | ICD-10-CM | POA: Insufficient documentation

## 2022-05-02 DIAGNOSIS — O09293 Supervision of pregnancy with other poor reproductive or obstetric history, third trimester: Secondary | ICD-10-CM | POA: Diagnosis not present

## 2022-05-02 DIAGNOSIS — Z362 Encounter for other antenatal screening follow-up: Secondary | ICD-10-CM

## 2022-05-03 NOTE — Progress Notes (Deleted)
[facility] Visit  Monica Byrd is a 37 y.o. [phone] at [redacted]w[redacted]d(via 29wk sono) [facility] Prenatal course, history, notes, ultrasounds, and laboratory results reviewed.  Denies cramping/ctx, fluid leaking, vaginal bleeding, or decreased fetal movement. Taking PNV.    Primary Prenatal Care Provider: Dr JJinny Sanders Postpartum Plans: - delivery planning: *** - circumcision: *** - feeding: *** - pediatrician: *** - contraception: ***  FHR: *** Uterine size: ***  Assessment & Plan  1. Routine prenatal care: - confirmed dating today by [id number] 07/29/22 - ***1 hr GTT today - CBC, HIV, RPR, Hgb electrophoresis today - offered Tdap, flu, COVID vaccines today  2. AMA at time of delivery- discussed genetic counseling. Opted for   3. H/o gestational diabetes- 1 hr GTT showed  4. History of preterm delivery  5. Chlamydia infection in pregnancy- needs TOC in 2 weeks, confirmed that she took treatment at home after vomiting observed treated in clinic.   6. Elevated creatinine- noted on previous lab work, will repeat with other blood work today.   7. ASCUS pap with HPV negative- needs repeat cotesting in 3 yrs.  8.History of schizoaffective disorder and PTSD- not currently on medications.   Next prenatal visit in 2 weeks - needs test of cure for chlamydia. Labor & fetal movement precautions discussed.  Monica SavannahMD [place]

## 2022-05-04 ENCOUNTER — Encounter: Payer: Self-pay | Admitting: Family Medicine

## 2022-05-04 ENCOUNTER — Ambulatory Visit (INDEPENDENT_AMBULATORY_CARE_PROVIDER_SITE_OTHER): Payer: Managed Care, Other (non HMO) | Admitting: Family Medicine

## 2022-05-04 VITALS — BP 94/55 | HR 94 | Wt 197.5 lb

## 2022-05-04 DIAGNOSIS — Z3482 Encounter for supervision of other normal pregnancy, second trimester: Secondary | ICD-10-CM

## 2022-05-04 DIAGNOSIS — Z23 Encounter for immunization: Secondary | ICD-10-CM

## 2022-05-04 NOTE — Progress Notes (Signed)
  [place] Prenatal Visit  Monica Byrd is a 37 y.o. [phone] at 19w5dhere for routine follow up. She is dated by 27w [place]  She reports no complaints. She reports fetal movement. Denies vaginal bleeding, loss of fluid, or contractions.  See flow sheet for details.  A/P: Pregnancy at 235w5d Doing well.   Dating reviewed, dating tab is correct Fetal heart tones Appropriate148 Fundal height within expected range. 27 Influenza vaccine previously administered.  - thinks she had in Oct at work COVID vaccination was discussed reports she had 2 vaccinations (last one in 2022) Screening for gestational diabetes Not completed to be done next week. .  Pregnancy education completed including: fetal growth, breastfeeding, contraception, and expected weight gain in pregnancy.   The patient does not have a history of Cesarean delivery and no referral to [facility] [place] [facility]. Preterm labor, bleeding, and pain precautions given.    2. Pregnancy issues include the following and were addressed as appropriate today:  Late to prenatal care Advanced maternal age upon delivery History of bipolar disorder-not on any medications, feels that mood is currently stable Patient is debating genetic screening, declines at this visit Tdap vaccination given at this visit Third trimester labs drawn History of chlamydial infection, needs test of cure in 2 weeks Problem list and pregnancy box updated: Yes.   Follow up on 05/10/2022

## 2022-05-04 NOTE — Patient Instructions (Addendum)
Everything looks good today!  Your new due date is 07/29/2022.  Please let us know if you have any issues, your next appointment is scheduled for next week, it is importantly make this appointment so we can check for diabetes at that time.  We are getting lab work and your Tdap vaccine today

## 2022-05-10 LAB — HIV ANTIBODY (ROUTINE TESTING W REFLEX): HIV Screen 4th Generation wRfx: NONREACTIVE

## 2022-05-10 LAB — BASIC METABOLIC PANEL
BUN/Creatinine Ratio: 11 (ref 9–23)
BUN: 11 mg/dL (ref 6–20)
CO2: 19 mmol/L — ABNORMAL LOW (ref 20–29)
Calcium: 9.1 mg/dL (ref 8.7–10.2)
Chloride: 104 mmol/L (ref 96–106)
Creatinine, Ser: 0.97 mg/dL (ref 0.57–1.00)
Glucose: 74 mg/dL (ref 70–99)
Potassium: 4.3 mmol/L (ref 3.5–5.2)
Sodium: 139 mmol/L (ref 134–144)
eGFR: 79 mL/min/{1.73_m2} (ref >59)

## 2022-05-10 LAB — HGB FRACTIONATION CASCADE
Hgb A2: 3 % (ref 1.8–3.2)
Hgb A: 97 % (ref 96.4–98.8)
Hgb F: 0 % (ref 0.0–2.0)
Hgb S: 0 %

## 2022-05-10 LAB — CBC
Hematocrit: 35.4 % (ref 34.0–46.6)
Hemoglobin: 11.9 g/dL (ref 11.1–15.9)
MCH: 30.9 pg (ref 26.6–33.0)
MCHC: 33.6 g/dL (ref 31.5–35.7)
MCV: 92 fL (ref 79–97)
Platelets: 255 10*3/uL (ref 150–450)
RBC: 3.85 x10E6/uL (ref 3.77–5.28)
RDW: 11.7 % (ref 11.7–15.4)
WBC: 9.9 10*3/uL (ref 3.4–10.8)

## 2022-05-10 LAB — RPR: RPR Ser Ql: NONREACTIVE

## 2022-05-14 ENCOUNTER — Other Ambulatory Visit: Payer: Self-pay

## 2022-05-14 DIAGNOSIS — Z3A26 26 weeks gestation of pregnancy: Secondary | ICD-10-CM

## 2022-05-14 MED ORDER — PRENATAL MULTIVITAMIN CH: 1.0000 | ORAL_TABLET | Freq: Every day | ORAL | 1 refills | Status: AC

## 2022-05-14 NOTE — Telephone Encounter (Signed)
Patient calls nurse line regarding prescription for Prenatal vitamins.   Previous prescription was sent to "print." Attempted to resend electronically. Reverted to print.   Called prescription in to [facility].   Talbot Grumbling, RN

## 2022-05-23 ENCOUNTER — Encounter: Payer: Self-pay | Admitting: Family Medicine

## 2022-05-31 ENCOUNTER — Ambulatory Visit (INDEPENDENT_AMBULATORY_CARE_PROVIDER_SITE_OTHER): Payer: Self-pay | Admitting: Family Medicine

## 2022-05-31 ENCOUNTER — Other Ambulatory Visit (HOSPITAL_COMMUNITY)
Admission: RE | Admit: 2022-05-31 | Discharge: 2022-05-31 | Disposition: A | Discharge status: Home / Self Care | Payer: Managed Care, Other (non HMO) | Source: Ambulatory Visit | Attending: Family Medicine | Admitting: Family Medicine

## 2022-05-31 ENCOUNTER — Other Ambulatory Visit: Payer: Self-pay

## 2022-05-31 VITALS — BP 122/78 | HR 90 | Wt 195.0 lb

## 2022-05-31 DIAGNOSIS — Z3482 Encounter for supervision of other normal pregnancy, second trimester: Secondary | ICD-10-CM

## 2022-05-31 DIAGNOSIS — O09523 Supervision of elderly multigravida, third trimester: Secondary | ICD-10-CM

## 2022-05-31 DIAGNOSIS — A749 Chlamydial infection, unspecified: Secondary | ICD-10-CM | POA: Insufficient documentation

## 2022-05-31 LAB — POCT 1 HR PRENATAL GLUCOSE: Glucose 1 Hr Prenatal, POC: 140 mg/dL

## 2022-05-31 NOTE — Patient Instructions (Signed)
Please make a follow up appointment in 2 weeks.  Prenatal Classes Go to [email or web address] for more information on the pregnancy and child birth classes that [place] has to offer.   Pregnancy Related Return Precautions The follow are signs/symptoms that are abnormal in pregnancy and may require further evaluation by a physician: Go to the MAU at [place] at [facility] if: You have cramping/contractions that do not go away with drinking water, especially if they are lasting 30 seconds to 1.5 minutes, coming and going every 5-10 minutes for an hour or more, or are getting stronger and you cannot walk or talk while having a contraction/cramp. Your water breaks.  Sometimes it is a big gush of fluid, sometimes it is just a trickle that keeps getting your underwear wet or running down your legs You have vaginal bleeding.    You do not feel your baby moving like normal.  If you do not, get something to eat and drink (something cold or something with sugar like peanut butter or juice) and lay down and focus on feeling your baby move. If your baby is still not moving like normal, you should go to MAU. You should feel your baby move 6 times in one hour, or 10 times in two hours. You have a persistent headache that does not go away with 1 g of Tylenol, vision changes, chest pain, difficulty breathing, severe pain in your right upper abdomen, worsening leg swelling- these can all be signs of high blood pressure in pregnancy and need to be evaluated by a provider immediately  These are all concerning in pregnancy and if you have any of these I recommend you call your PCP and present to the Maternity Admissions Unit (map below) for further evaluation.  For any pregnancy-related emergencies, please go to the Maternity Admissions Unit in the [place] at [place] will use hospital Entrance C.    Our clinic number is [phone].   Dr Thompson Grayer

## 2022-05-31 NOTE — Addendum Note (Signed)
Addended by: Maryland Pink on: 05/31/2022 12:11 PM   Modules accepted: Orders

## 2022-05-31 NOTE — Progress Notes (Signed)
  [place] Prenatal Visit  Monica Byrd is a 37 y.o. [phone] at [redacted]w[redacted]d here for routine follow up. She is dated by midtrimester ultrasound.  She reports no bleeding, no contractions, no cramping, and no leaking.  She reports fetal movement. She denies vaginal bleeding, contractions, or loss of fluid.  See flow sheet for details.  Vitals:   05/31/22 1100  BP: 122/78  Pulse: 90     A/P: Pregnancy at [redacted]w[redacted]d.  Doing well.   Routine prenatal care:  Dating reviewed, dating tab is correct Fetal heart tones: Appropriate Fundal height: within expected range.  The patient does not have a history of HSV and valacyclovir is not indicated at this time.  The patient does not have a history of Cesarean delivery and no referral to [facility] for [place] is indicated Infant feeding choice: Breastfeeding Contraception choice: condoms and abstinence  Infant circumcision desired yes Influenza vaccine not administered as not influenza season.   Tdap was not given today. Childbirth and education classes were offered. Pregnancy education regarding benefits of breastfeeding, contraception, fetal growth, expected weight gain, and safe infant sleep were discussed.  Preterm labor and fetal movement precautions reviewed.   2. Pregnancy issues include the following and were addressed as appropriate today:  Prior chlamydia infxn, TOC performed today. -  For mood and anxiety, pt reports is well controlled.   Problem list and pregnancy box updated: Yes.   Scheduled for [place] clinic in third trimester on today.   Follow up 2 weeks.

## 2022-06-01 LAB — CERVICOVAGINAL ANCILLARY ONLY
Chlamydia: NEGATIVE
Comment: NEGATIVE
Comment: NORMAL
Neisseria Gonorrhea: NEGATIVE

## 2022-06-04 ENCOUNTER — Other Ambulatory Visit: Payer: Managed Care, Other (non HMO)

## 2022-06-05 ENCOUNTER — Telehealth: Payer: Self-pay

## 2022-06-05 NOTE — Telephone Encounter (Signed)
-----   Message from Gerrit Heck, MD sent at 06/05/2022  1:50 PM EDT ----- Regarding: FW: Please call patient and reschedule lab appt Hi,  Are you all able to schedule this for this patient? Thank you so much  Mayuri ----- Message ----- From: Lenoria Chime, MD Sent: 06/04/2022   2:43 PM EDT To: Maryland Pink, CMA; Gerrit Heck, MD; # Subject: Please call patient and reschedule lab appt    Hi team,  Can we please call pt and reschedule a lab appt in the AM at 830 any day of the week to repeat a three hour glucose tolerance test, and please remind her to come fasting.  Thanks, Dr Thompson Grayer

## 2022-06-05 NOTE — Telephone Encounter (Signed)
Patient returns call to nurse line.   Patient advised she needs to schedule a 3 hour GTT.   Patient stated, "I will call you back for that."

## 2022-06-05 NOTE — Telephone Encounter (Signed)
Attempted to reach patient to make 3 hr GTT appt. No answer. Did LVM of message left. Salvatore Marvel, CMA

## 2022-06-06 ENCOUNTER — Telehealth: Payer: Self-pay

## 2022-06-06 NOTE — Telephone Encounter (Signed)
Spoke with patient she stated that she will call, and make an appt for the 3 hr. GTT. Salvatore Marvel, CMA

## 2022-06-06 NOTE — Telephone Encounter (Signed)
-----   Message from Gerrit Heck, MD sent at 06/05/2022  1:50 PM EDT ----- Regarding: FW: Please call patient and reschedule lab appt Hi,  Are you all able to schedule this for this patient? Thank you so much  Mayuri ----- Message ----- From: Lenoria Chime, MD Sent: 06/04/2022   2:43 PM EDT To: Maryland Pink, CMA; Gerrit Heck, MD; # Subject: Please call patient and reschedule lab appt    Hi team,  Can we please call pt and reschedule a lab appt in the AM at 830 any day of the week to repeat a three hour glucose tolerance test, and please remind her to come fasting.  Thanks, Dr Thompson Grayer

## 2022-06-07 ENCOUNTER — Ambulatory Visit: Payer: Managed Care, Other (non HMO)

## 2022-06-21 ENCOUNTER — Other Ambulatory Visit: Payer: Self-pay

## 2022-06-22 ENCOUNTER — Encounter: Payer: Managed Care, Other (non HMO) | Admitting: Student

## 2022-06-25 ENCOUNTER — Inpatient Hospital Stay (HOSPITAL_COMMUNITY)
Admission: AD | Admit: 2022-06-25 | Discharge: 2022-06-26 | Disposition: A | Discharge status: Home / Self Care | Payer: Managed Care, Other (non HMO) | Attending: Obstetrics and Gynecology | Admitting: Obstetrics and Gynecology

## 2022-06-25 ENCOUNTER — Encounter (HOSPITAL_COMMUNITY): Payer: Self-pay | Admitting: Obstetrics and Gynecology

## 2022-06-25 DIAGNOSIS — O4703 False labor before 37 completed weeks of gestation, third trimester: Secondary | ICD-10-CM | POA: Diagnosis not present

## 2022-06-25 DIAGNOSIS — O10913 Unspecified pre-existing hypertension complicating pregnancy, third trimester: Secondary | ICD-10-CM | POA: Insufficient documentation

## 2022-06-25 DIAGNOSIS — O479 False labor, unspecified: Secondary | ICD-10-CM

## 2022-06-25 DIAGNOSIS — Z3A35 35 weeks gestation of pregnancy: Secondary | ICD-10-CM | POA: Diagnosis not present

## 2022-06-25 DIAGNOSIS — Z3689 Encounter for other specified antenatal screening: Secondary | ICD-10-CM

## 2022-06-25 LAB — URINALYSIS, ROUTINE W REFLEX MICROSCOPIC
Bilirubin Urine: NEGATIVE
Glucose, UA: NEGATIVE mg/dL
Hgb urine dipstick: NEGATIVE
Ketones, ur: NEGATIVE mg/dL
Nitrite: NEGATIVE
Protein, ur: NEGATIVE mg/dL
Specific Gravity, Urine: 1.029 (ref 1.005–1.030)
pH: 6 (ref 5.0–8.0)

## 2022-06-25 MED ORDER — NIFEDIPINE 10 MG PO CAPS
10.0000 mg | ORAL_CAPSULE | ORAL | Status: AC
  Administered 2022-06-25 – 2022-06-26 (×3): 10 mg via ORAL
  Filled 2022-06-25 (×3): qty 1

## 2022-06-25 MED ORDER — LACTATED RINGERS IV BOLUS
1000.0000 mL | Freq: Once | INTRAVENOUS | Status: AC
  Administered 2022-06-25: 1000 mL via INTRAVENOUS

## 2022-06-25 NOTE — MAU Note (Signed)
.  Monica Byrd is a 37 y.o. at [redacted]w[redacted]d here in MAU reporting: CTX since 0730, every apart 4-36mins. Pt endorses FM. Pt denies VB, LOF, abnormal discharge, PIH s/s, and complications in the pregnancy. Pt called OB call on told patient   Onset of complaint: 0730 Pain score: 7/10 Vitals:   06/25/22 2256  BP: 118/80  Pulse: 85  Resp: 18  Temp: 97.8 F (36.6 C)  SpO2: 99%     FHT:160 Lab orders placed from triage:  UA

## 2022-06-25 NOTE — MAU Provider Note (Signed)
History     CSN: [id number]  Arrival date and time: 06/25/22 2222   Event Date/Time   First Provider Initiated Contact with Patient 06/25/22 2333      Chief Complaint  Patient presents with   Contractions   36 y.o. [id number] .1 wks presenting with ctx. Reports onset about 4 hrs ago. Frequency is q4 min. Denies VB or LOF. Reports good FM. Denies urinary sx.     OB History     Gravida  4   Para  1   Term      Preterm  1   AB  2   Living  1      SAB      IAB  2   Ectopic      Multiple      Live Births  1           Past Medical History:  Diagnosis Date   Anxiety    Depression    Hypertension     Past Surgical History:  Procedure Laterality Date   NO PAST SURGERIES      Family History  Problem Relation Age of Onset   Pancreatic cancer Maternal Grandfather     Social History   Tobacco Use   Smoking status: Never   Smokeless tobacco: Never  Vaping Use   Vaping Use: Never used  Substance Use Topics   Alcohol use: Not Currently    Comment: 1-2 drinks occasionally    Drug use: Not Currently    Types: Marijuana    Comment: pt currently denies as of 10/10/17    Allergies: No Known Allergies  Medications Prior to Admission  Medication Sig Dispense Refill Last Dose   Prenatal Vit-Fe Fumarate-FA (PRENATAL MULTIVITAMIN) TABS tablet Take 1 tablet by mouth daily at 12 noon. 30 tablet 1 Past Week   ondansetron (ZOFRAN-ODT) 4 MG disintegrating tablet Take 1 4 mg oral disintegrating tablet 15-30 minutes before taking antibiotic dose. (Patient not taking: Reported on 05/02/2022) 1 tablet 0     Review of Systems  Gastrointestinal:  Negative for abdominal pain.  Genitourinary:  Negative for vaginal bleeding and vaginal discharge.   Physical Exam   Blood pressure 110/73, pulse 97, temperature 97.8 F (36.6 C), temperature source Oral, resp. rate 18, height  (1.626 m), weight 92.4 kg, last menstrual period 10/15/2021, SpO2 99 %.  Physical  Exam Vitals and nursing note reviewed. Exam conducted with a chaperone present.  Constitutional:      General: She is not in acute distress.    Appearance: Normal appearance.  HENT:     Head: Normocephalic and atraumatic.  Cardiovascular:     Rate and Rhythm: Normal rate.  Pulmonary:     Effort: Pulmonary effort is normal. No respiratory distress.  Abdominal:     Palpations: Abdomen is soft.     Tenderness: There is no abdominal tenderness.  Genitourinary:    Comments: VE: 1/60/-2 vtx Musculoskeletal:        General: Normal range of motion.     Cervical back: Normal range of motion.  Skin:    General: Skin is warm and dry.  Neurological:     General: No focal deficit present.     Mental Status: She is alert and oriented to person, place, and time.  Psychiatric:        Mood and Affect: Mood normal.        Behavior: Behavior normal.   EFM: 150 bpm, mod variability, + accels, no  decels Toco: 3-5  Results for orders placed or performed during the hospital encounter of 06/25/22 (from the past 24 hour(s))  Urinalysis, Routine w reflex microscopic -Urine, Clean Catch     Status: Abnormal   Collection Time: 06/25/22 11:16 PM  Result Value Ref Range   Color, Urine YELLOW YELLOW   APPearance HAZY (A) CLEAR   Specific Gravity, Urine 1.029 1.005 - 1.030   pH 6.0 5.0 - 8.0   Glucose, UA NEGATIVE NEGATIVE mg/dL   Hgb urine dipstick NEGATIVE NEGATIVE   Bilirubin Urine NEGATIVE NEGATIVE   Ketones, ur NEGATIVE NEGATIVE mg/dL   Protein, ur NEGATIVE NEGATIVE mg/dL   Nitrite NEGATIVE NEGATIVE   Leukocytes,Ua SMALL (A) NEGATIVE   RBC / HPF 0-5 0 - 5 RBC/hpf   WBC, UA 0-5 0 - 5 WBC/hpf   Bacteria, UA RARE (A) NONE SEEN   Squamous Epithelial / HPF 6-10 0 - 5 /HPF   Mucus PRESENT    MAU Course  Procedures LR Procardia Terbutaline  MDM Labs ordered and reviewed.  0100: No improvement in ctx, Terbutaline ordered.  0200: Feeling better after IVF and Terbutaline, reports less  frequency, irreg on toco. Cervix unchanged. Stable for discharge home.   Assessment and Plan   1. [redacted] weeks gestation of pregnancy   2. Braxton Hicks contractions   3. NST (non-stress test) reactive    Discharge home Follow up at [facility] as scheduled PTL precautions  Allergies as of 06/26/2022   No Known Allergies      Medication List     STOP taking these medications    ondansetron 4 MG disintegrating tablet Commonly known as: ZOFRAN-ODT       TAKE these medications    prenatal multivitamin Tabs tablet Take 1 tablet by mouth daily at 12 noon.        Donette Larry, CNM 06/25/2022, 11:33 PM

## 2022-06-26 DIAGNOSIS — Z3689 Encounter for other specified antenatal screening: Secondary | ICD-10-CM

## 2022-06-26 DIAGNOSIS — O479 False labor, unspecified: Secondary | ICD-10-CM

## 2022-06-26 DIAGNOSIS — Z3A35 35 weeks gestation of pregnancy: Secondary | ICD-10-CM | POA: Diagnosis not present

## 2022-06-26 DIAGNOSIS — O4703 False labor before 37 completed weeks of gestation, third trimester: Secondary | ICD-10-CM | POA: Diagnosis not present

## 2022-06-26 MED ORDER — TERBUTALINE SULFATE 1 MG/ML IJ SOLN
0.2500 mg | Freq: Once | INTRAMUSCULAR | Status: AC
  Administered 2022-06-26: 0.25 mg via SUBCUTANEOUS
  Filled 2022-06-26: qty 1

## 2022-06-26 MED ORDER — ACETAMINOPHEN 500 MG PO TABS
1000.0000 mg | ORAL_TABLET | Freq: Four times a day (QID) | ORAL | Status: DC | PRN
  Administered 2022-06-26: 1000 mg via ORAL
  Filled 2022-06-26: qty 2

## 2022-06-27 ENCOUNTER — Encounter: Payer: Managed Care, Other (non HMO) | Admitting: Student

## 2022-06-27 NOTE — Progress Notes (Deleted)
  [facility] Prenatal Visit  Monica Byrd is a 37 y.o. [phone] at [redacted]w[redacted]d here for routine follow up. She is dated by {Ob dating:14516}.  She reports {symptoms; pregnancy related:14538}.  She reports fetal movement. She denies vaginal bleeding, contractions, or loss of fluid.  See flow sheet for details.  There were no vitals filed for this visit.   A/P: Pregnancy at [redacted]w[redacted]d.  Doing well.   Routine prenatal care:  Dating reviewed, dating tab is {correct:23336::"correct"} Fetal heart tones: {appropriate:23337} Fundal height: {fundal height:23342::"within expected range. "} The patient {does/does not:200015::"does not"} have a history of HSV and valacyclovir {ACTION; IS/IS NOT:21021397::"is not"} indicated at this time.  The patient {CWHCS:23409::"does not have a history of Cesarean delivery and no referral to [facility] is indicated"} Infant feeding choice: {Breasfeeding:23347::"Breastfeeding"} Contraception choice: {postpartum contraception:23348} Infant circumcision desired {Response; yes/no/na:63} Influenza vaccine {given:23340}  Tdap {was/was not:19854::"was"} given today. COVID vaccination was discussed and ***.  Childbirth and education classes {WERE / WERE AVW:09811} offered. Pregnancy education regarding benefits of breastfeeding, contraception, fetal growth, expected weight gain, and safe infant sleep were discussed.  Preterm labor and fetal movement precautions reviewed.   2. Pregnancy issues include the following and were addressed as appropriate today:  ***  Problem list and pregnancy box updated: {yes/no:20286::"Yes"}.   Scheduled for Ob Faculty clinic in third trimester on ***.   Follow up 2 weeks.

## 2022-06-28 ENCOUNTER — Inpatient Hospital Stay (HOSPITAL_COMMUNITY)
Admission: AD | Admit: 2022-06-28 | Discharge: 2022-06-28 | Disposition: A | Discharge status: Home / Self Care | Payer: Managed Care, Other (non HMO) | Attending: Obstetrics and Gynecology | Admitting: Obstetrics and Gynecology

## 2022-06-28 ENCOUNTER — Encounter (HOSPITAL_COMMUNITY): Payer: Self-pay | Admitting: Obstetrics and Gynecology

## 2022-06-28 ENCOUNTER — Ambulatory Visit (INDEPENDENT_AMBULATORY_CARE_PROVIDER_SITE_OTHER): Payer: Managed Care, Other (non HMO) | Admitting: Family Medicine

## 2022-06-28 VITALS — BP 100/70 | HR 73 | Wt 203.0 lb

## 2022-06-28 DIAGNOSIS — B9689 Other specified bacterial agents as the cause of diseases classified elsewhere: Secondary | ICD-10-CM | POA: Diagnosis not present

## 2022-06-28 DIAGNOSIS — Z3492 Encounter for supervision of normal pregnancy, unspecified, second trimester: Secondary | ICD-10-CM

## 2022-06-28 DIAGNOSIS — O4703 False labor before 37 completed weeks of gestation, third trimester: Secondary | ICD-10-CM | POA: Insufficient documentation

## 2022-06-28 DIAGNOSIS — O479 False labor, unspecified: Secondary | ICD-10-CM

## 2022-06-28 DIAGNOSIS — Z3A35 35 weeks gestation of pregnancy: Secondary | ICD-10-CM | POA: Diagnosis not present

## 2022-06-28 DIAGNOSIS — O23593 Infection of other part of genital tract in pregnancy, third trimester: Secondary | ICD-10-CM | POA: Diagnosis not present

## 2022-06-28 DIAGNOSIS — N760 Acute vaginitis: Secondary | ICD-10-CM

## 2022-06-28 DIAGNOSIS — Z113 Encounter for screening for infections with a predominantly sexual mode of transmission: Secondary | ICD-10-CM | POA: Diagnosis not present

## 2022-06-28 LAB — URINALYSIS, ROUTINE W REFLEX MICROSCOPIC
Bacteria, UA: NONE SEEN
Bilirubin Urine: NEGATIVE
Glucose, UA: NEGATIVE mg/dL
Hgb urine dipstick: NEGATIVE
Ketones, ur: NEGATIVE mg/dL
Nitrite: NEGATIVE
Protein, ur: NEGATIVE mg/dL
Specific Gravity, Urine: 1.02 (ref 1.005–1.030)
pH: 5 (ref 5.0–8.0)

## 2022-06-28 LAB — WET PREP, GENITAL
Sperm: NONE SEEN
Trich, Wet Prep: NONE SEEN
WBC, Wet Prep HPF POC: >=10 — AB (ref <10)
Yeast Wet Prep HPF POC: NONE SEEN

## 2022-06-28 MED ORDER — METRONIDAZOLE 500 MG PO TABS: 500.0000 mg | ORAL_TABLET | Freq: Two times a day (BID) | ORAL | 0 refills | Status: DC

## 2022-06-28 NOTE — Progress Notes (Signed)
  [facility] Prenatal Visit  Monica Byrd is a 37 y.o. [phone] at [redacted]w[redacted]d for routine follow up.  She reports continued contractions. She reports fetal movement. She denies vaginal bleeding, contractions, or loss of fluid.   She went to the MAU on 4/22 due to contractions occurring every 4 minutes. Was given procardia and Terbutaline. NST was reactive   Today still endorses contractions that are intermittent states she feels the tightening and pain. Sometimes occurs every 4 minutes and lasts about 40-50seconds   See flow sheet for details.   Vitals:   06/28/22 0934  BP: 100/70  Pulse: 73     A/P: Pregnancy at [redacted]w[redacted]d.  Doing well.   Routine prenatal care:  Infant feeding choice: breast Contraception choice: condoms  Infant circumcision desired yes Tdap previously given  Declined COVID vaccination  GBS/GC/CZ testing was not performed today as she is not 36 weeks. Will obtain at follow up Preterm labor precautions reviewed. Safe sleep discussed. Kick counts reviewed. Preterm labor precautions reviewed. Safe sleep discussed. Kick counts reviewed.  2. Pregnancy issues include the following and were addressed as appropriate today:  - Patient experiencing frequent and regular contractions (had 3 today in visit). Possibly Deberah Pelton but advised she go to the MAU for NST especially given history of delivery at 36 weeks.  - Abnormal 1 h gtt of 140 one month ago. Lost to follow up after. Could not do 2 hr today since she wasn't fasting. Follow up scheduled next week and will get 2 hr gtt at that time  - AMA at delivery - H/o spontaneous preterm delivery at 36 WGA. - H/o Chlamydia in pregnancy- s/p treatment, test of cure negative one month ago - H/o  ASCUS pap HPV neg- repeat cotesting in 3 yrs. - H/o mood disorder- mood well controlled off of medications   Problem list and pregnancy box updated: Yes.   Follow up in 2 weeks.

## 2022-06-28 NOTE — MAU Note (Addendum)
...  Monica Byrd is a 37 y.o. at [redacted]w[redacted]d here in MAU reporting: Sent over from [facility] for cervical exam and NST. She reports she has been experiencing contractions every four minutes for the past three days. She reports if she is up moving around her contractions will occasionally become closer together. She reports she was here in MAU on 4/22 and reports the injection she received did stop her contractions but as soon as she got home, her contractions returned. Denies VB or LOF. +FM.   Patient desires vaginal swabs to check for infection. She reports she has milky discharge but reports yesterday her discharge was yellow.  Onset of complaint: x3 days Pain score: 8/10 - "sometimes worse" - right side of her mid abdomen and sometimes in the middle of her lower abdomen  FHT: 145 initial external Lab orders placed from triage: none

## 2022-06-28 NOTE — Patient Instructions (Addendum)
It was great seeing you today!  As we discussed we recommend going to the MAU now since you are having frequent regular contractions so they can do another fetal stress test.   We have your next 2 appointments scheduled but if anything is needed prior to then just let us know!   Just a reminder for your next visit be sure to fast so we can check your blood sugars  Feel free to call with any questions or concerns at any time, at [phone].   Take care,  Dr. Cora Collum [facility]

## 2022-06-28 NOTE — Discharge Instructions (Signed)

## 2022-06-28 NOTE — MAU Provider Note (Signed)
History     CSN: [id number]  Arrival date and time: 06/28/22 1022   Event Date/Time   First Provider Initiated Contact with Patient 06/28/22 1048      Chief Complaint  Patient presents with   Contractions   HPI  Monica Byrd is a 37 y.o. [id number] at [redacted]w[redacted]d who presents from [facility] for evaluation of contractions. Patient reports she is still having contractions every 5-10 minutes. She reports they are not painful but she is still having them. She also reports an increase in discharge and is requesting to be tested for STDs. She denies any vaginal bleeding and leaking of fluid. Denies any constipation, diarrhea or any urinary complaints. Reports normal fetal movement.   OB History     Gravida  4   Para  1   Term      Preterm  1   AB  2   Living  1      SAB      IAB  2   Ectopic      Multiple      Live Births  1           Past Medical History:  Diagnosis Date   Anxiety    Depression    Hypertension     Past Surgical History:  Procedure Laterality Date   NO PAST SURGERIES      Family History  Problem Relation Age of Onset   Pancreatic cancer Maternal Grandfather     Social History   Tobacco Use   Smoking status: Never   Smokeless tobacco: Never  Vaping Use   Vaping Use: Never used  Substance Use Topics   Alcohol use: Not Currently    Comment: 1-2 drinks occasionally    Drug use: Not Currently    Types: Marijuana    Comment: pt currently denies as of 10/10/17    Allergies: No Known Allergies  No medications prior to admission.    Review of Systems  Constitutional: Negative.  Negative for fatigue and fever.  HENT: Negative.    Respiratory: Negative.  Negative for shortness of breath.   Cardiovascular: Negative.  Negative for chest pain.  Gastrointestinal:  Positive for abdominal pain. Negative for constipation, diarrhea, nausea and vomiting.  Genitourinary:  Positive for vaginal discharge. Negative for dysuria and vaginal  bleeding.  Neurological: Negative.  Negative for dizziness and headaches.   Physical Exam   Blood pressure 122/89, pulse 75, temperature 97.8 F (36.6 C), temperature source Oral, resp. rate 15, last menstrual period 10/15/2021, SpO2 99 %.  Patient Vitals for the past 24 hrs:  BP Temp Temp src Pulse Resp SpO2  06/28/22 1100 -- -- -- -- -- 99 %  06/28/22 1054 122/89 -- -- 75 -- --  06/28/22 1043 118/78 97.8 F (36.6 C) Oral 78 15 99 %    Physical Exam Vitals and nursing note reviewed.  Constitutional:      General: She is not in acute distress.    Appearance: She is well-developed.  HENT:     Head: Normocephalic.  Eyes:     Pupils: Pupils are equal, round, and reactive to light.  Cardiovascular:     Rate and Rhythm: Normal rate and regular rhythm.     Heart sounds: Normal heart sounds.  Pulmonary:     Effort: Pulmonary effort is normal. No respiratory distress.     Breath sounds: Normal breath sounds.  Abdominal:     General: Bowel sounds are normal. There is no  distension.     Palpations: Abdomen is soft.     Tenderness: There is no abdominal tenderness.  Skin:    General: Skin is warm and dry.  Neurological:     Mental Status: She is alert and oriented to person, place, and time.  Psychiatric:        Mood and Affect: Mood normal.        Behavior: Behavior normal.        Thought Content: Thought content normal.        Judgment: Judgment normal.     Fetal Tracing:  Baseline: 140 Variability: moderate Accels: 15x15 Decels: none  Toco: 2 UCs  Dilation: 1 Effacement (%): 50 Station: Ballotable Exam by:: Cleone Slim, CNM   MAU Course  Procedures  Results for orders placed or performed during the hospital encounter of 06/28/22 (from the past 24 hour(s))  Wet prep, genital     Status: Abnormal   Collection Time: 06/28/22 10:48 AM   Specimen: PATH Cytology Cervicovaginal Ancillary Only  Result Value Ref Range   Yeast Wet Prep HPF POC NONE SEEN NONE  SEEN   Trich, Wet Prep NONE SEEN NONE SEEN   Clue Cells Wet Prep HPF POC PRESENT (A) NONE SEEN   WBC, Wet Prep HPF POC >=10 (A) <10   Sperm NONE SEEN   Urinalysis, Routine w reflex microscopic -Urine, Clean Catch     Status: Abnormal   Collection Time: 06/28/22 10:52 AM  Result Value Ref Range   Color, Urine YELLOW YELLOW   APPearance HAZY (A) CLEAR   Specific Gravity, Urine 1.020 1.005 - 1.030   pH 5.0 5.0 - 8.0   Glucose, UA NEGATIVE NEGATIVE mg/dL   Hgb urine dipstick NEGATIVE NEGATIVE   Bilirubin Urine NEGATIVE NEGATIVE   Ketones, ur NEGATIVE NEGATIVE mg/dL   Protein, ur NEGATIVE NEGATIVE mg/dL   Nitrite NEGATIVE NEGATIVE   Leukocytes,Ua SMALL (A) NEGATIVE   RBC / HPF 0-5 0 - 5 RBC/hpf   WBC, UA 0-5 0 - 5 WBC/hpf   Bacteria, UA NONE SEEN NONE SEEN   Squamous Epithelial / HPF 0-5 0 - 5 /HPF   Mucus PRESENT      MDM Labs ordered and reviewed.   UA Wet prep and gc/chlamydia NST reactive and cervix unchanged from previous exam.  Assessment and Plan   1. Bacterial vaginosis   2. Braxton Hick's contraction   3. [redacted] weeks gestation of pregnancy     -Discharge home in stable condition -RX for metronidazole sent to pharmacy -Labor precautions discussed -Patient advised to follow-up with OB as scheduled for prenatal care -Patient may return to MAU as needed or if her condition were to change or worsen  Rolm Bookbinder, CNM 06/28/2022, 10:48 AM

## 2022-06-29 LAB — GC/CHLAMYDIA PROBE AMP (~~LOC~~) NOT AT ARMC
Chlamydia: NEGATIVE
Comment: NEGATIVE
Comment: NORMAL
Neisseria Gonorrhea: NEGATIVE

## 2022-07-06 ENCOUNTER — Encounter: Payer: Self-pay | Admitting: Student

## 2022-07-09 ENCOUNTER — Encounter: Payer: Managed Care, Other (non HMO) | Admitting: Family Medicine

## 2022-07-12 NOTE — Progress Notes (Cosign Needed Addendum)
  [facility] Prenatal Visit  Monica Byrd is a 37 y.o. [phone] at [redacted]w[redacted]d here for routine follow up. She is dated by early ultrasound.  She reports occasional contractions every 8 minutes that are weak. She reports fetal movement. She denies vaginal bleeding, or loss of fluid.  Seen recently due to [facility] and was 1 cm at that time. See flow sheet for details.  Vitals:   07/13/22 1332  BP: 112/78  Pulse: 99   General: NAD, pleasant, able to participate in exam Respiratory: Normal effort, no obvious respiratory distress Abdomen: Gravid, measuring at 37, +fetal movement, bedside ultrasound confirmed vertex position Pelvic: VULVA: normal appearing vulva with no masses, tenderness or lesions, VAGINA: Normal appearing vagina with normal color, no lesions, with scant discharge present  Chaperone Tashira Legette CMA present for pelvic exam   A/P: Pregnancy at [redacted]w[redacted]d.  Doing well.   Routine prenatal care  Dating reviewed, dating tab is correct Fetal heart tones Appropriate 145 Fundal height within expected range.  Fetal position confirmed Vertex using Ultrasound .  GBS collected today. .  Repeat GC/CT collected today.  Declines cervical check today The patient does not have a history of HSV and valacyclovir is not indicated at this time.  Infant feeding choice: Breastfeeding Contraception choice: Undecided -currently thinking about condoms and abstinence-discussed other methods with her as well Infant circumcision desired yes Influenza vaccine not administered as not influenza season.   Tdap previously administered between 27-36 weeks  Pregnancy education regarding preterm labor, fetal movement,  benefits of breastfeeding, contraception, fetal growth, expected weight gain, and safe infant sleep were discussed.   2. Pregnancy issues include the following and were addressed as appropriate today:  -History of chlamydia in pregnancy s/p treatment-test of cure 1 month  ago negative and repeat gonorrhea chlamydia swab today -History of spontaneous preterm delivery at 36 weeks, now doing better at 37 -History of ASCUS HPV negative, repeat cotesting in 3 years -History of mood disorder well-controlled off of medicines currently   Problem list and pregnancy box updated: Yes.  Follow up 1 week.

## 2022-07-13 ENCOUNTER — Encounter: Payer: Self-pay | Admitting: Student

## 2022-07-13 ENCOUNTER — Other Ambulatory Visit (HOSPITAL_COMMUNITY)
Admission: RE | Admit: 2022-07-13 | Discharge: 2022-07-13 | Disposition: A | Discharge status: Home / Self Care | Payer: Managed Care, Other (non HMO) | Source: Ambulatory Visit | Attending: Family Medicine | Admitting: Family Medicine

## 2022-07-13 ENCOUNTER — Ambulatory Visit (INDEPENDENT_AMBULATORY_CARE_PROVIDER_SITE_OTHER): Payer: Managed Care, Other (non HMO) | Admitting: Student

## 2022-07-13 VITALS — BP 112/78 | HR 99 | Wt 205.4 lb

## 2022-07-13 DIAGNOSIS — Z3A37 37 weeks gestation of pregnancy: Secondary | ICD-10-CM

## 2022-07-13 DIAGNOSIS — Z3483 Encounter for supervision of other normal pregnancy, third trimester: Secondary | ICD-10-CM | POA: Diagnosis not present

## 2022-07-13 MED ORDER — LANCET DEVICE MISC
1.0000 | Freq: Three times a day (TID) | 0 refills | Status: DC
Start: 2022-07-13 — End: 2022-07-25

## 2022-07-13 MED ORDER — BLOOD GLUCOSE TEST VI STRP
1.0000 | ORAL_STRIP | Freq: Three times a day (TID) | 0 refills | Status: DC
Start: 2022-07-13 — End: 2022-07-25

## 2022-07-13 MED ORDER — LANCETS MISC. MISC
1.0000 | Freq: Three times a day (TID) | 0 refills | Status: DC
Start: 2022-07-13 — End: 2022-07-25

## 2022-07-13 MED ORDER — BLOOD GLUCOSE MONITORING SUPPL DEVI
0 refills | Status: DC
Start: 2022-07-13 — End: 2022-07-25

## 2022-07-13 NOTE — Addendum Note (Signed)
Addended by: Levin Erp on: 07/13/2022 05:04 PM   Modules accepted: Orders

## 2022-07-13 NOTE — Patient Instructions (Signed)
It was great to see you! Thank you for allowing me to participate in your care!   I recommend that you always bring your medications to each appointment as this makes it easy to ensure we are on the correct medications and helps [place] not miss when refills are needed. I will let you know what your swabs show!  Our plans for today:  Go to the MAU at [facility] if: You begin to have strong, frequent contractions Your water breaks.  Sometimes it is a big gush of fluid, sometimes it is just a trickle that keeps getting your underwear wet or running down your legs You have vaginal bleeding.  It is normal to have a small amount of spotting if your cervix was checked.  You do not feel your baby moving like normal.  If you do not, get something to eat and drink and lay down and focus on feeling your baby move.   If your baby is still not moving like normal, you should go to MAU.   We are checking some labs today, I will call you if they are abnormal will send you a MyChart message or a letter if they are normal.  If you do not hear about your labs in the next 2 weeks please let us know.  Take care and seek immediate care sooner if you develop any concerns. Please remember to show up 15 minutes before your scheduled appointment time!  Levin Erp, MD [facility]

## 2022-07-16 ENCOUNTER — Other Ambulatory Visit (INDEPENDENT_AMBULATORY_CARE_PROVIDER_SITE_OTHER): Payer: Managed Care, Other (non HMO)

## 2022-07-16 DIAGNOSIS — Z3482 Encounter for supervision of other normal pregnancy, second trimester: Secondary | ICD-10-CM | POA: Diagnosis not present

## 2022-07-16 DIAGNOSIS — O09523 Supervision of elderly multigravida, third trimester: Secondary | ICD-10-CM

## 2022-07-16 LAB — CERVICOVAGINAL ANCILLARY ONLY
Chlamydia: NEGATIVE
Comment: NEGATIVE
Comment: NORMAL
Neisseria Gonorrhea: NEGATIVE

## 2022-07-16 LAB — POCT CBG (FASTING - GLUCOSE)-MANUAL ENTRY: Glucose Fasting, POC: 87 mg/dL (ref 70–99)

## 2022-07-16 LAB — CULTURE, BETA STREP (GROUP B ONLY)

## 2022-07-17 LAB — GESTATIONAL GLUCOSE TOLERANCE
Glucose, Fasting: 73 mg/dL (ref 70–94)
Glucose, GTT - 1 Hour: 103 mg/dL (ref 70–179)
Glucose, GTT - 2 Hour: 99 mg/dL (ref 70–154)
Glucose, GTT - 3 Hour: 79 mg/dL (ref 70–139)

## 2022-07-20 ENCOUNTER — Telehealth: Payer: Self-pay

## 2022-07-20 NOTE — Telephone Encounter (Signed)
Patient calls nurse line regarding heartburn. She reports that she has been having heartburn for the last two days.   Discussed supportive measures with patient and provided information on pregnancy safe OTC medications for heartburn.   Return precautions discussed.   No further questions at this time.   Veronda Prude, RN

## 2022-07-24 ENCOUNTER — Encounter (HOSPITAL_COMMUNITY): Payer: Self-pay | Admitting: Obstetrics and Gynecology

## 2022-07-24 ENCOUNTER — Inpatient Hospital Stay (HOSPITAL_COMMUNITY)
Admission: AD | Admit: 2022-07-24 | Discharge: 2022-07-25 | DRG: 807 | Disposition: A | Discharge status: Home / Self Care | Payer: Managed Care, Other (non HMO) | Attending: Obstetrics and Gynecology | Admitting: Obstetrics and Gynecology

## 2022-07-24 ENCOUNTER — Telehealth: Payer: Self-pay

## 2022-07-24 DIAGNOSIS — Z3A39 39 weeks gestation of pregnancy: Secondary | ICD-10-CM

## 2022-07-24 DIAGNOSIS — O99892 Other specified diseases and conditions complicating childbirth: Secondary | ICD-10-CM | POA: Diagnosis not present

## 2022-07-24 DIAGNOSIS — R030 Elevated blood-pressure reading, without diagnosis of hypertension: Secondary | ICD-10-CM | POA: Diagnosis not present

## 2022-07-24 DIAGNOSIS — O26893 Other specified pregnancy related conditions, third trimester: Secondary | ICD-10-CM | POA: Diagnosis present

## 2022-07-24 DIAGNOSIS — Z3492 Encounter for supervision of normal pregnancy, unspecified, second trimester: Secondary | ICD-10-CM

## 2022-07-24 DIAGNOSIS — O0933 Supervision of pregnancy with insufficient antenatal care, third trimester: Secondary | ICD-10-CM | POA: Diagnosis not present

## 2022-07-24 LAB — CBC
HCT: 39.6 % (ref 36.0–46.0)
Hemoglobin: 13.3 g/dL (ref 12.0–15.0)
MCH: 30.9 pg (ref 26.0–34.0)
MCHC: 33.6 g/dL (ref 30.0–36.0)
MCV: 92.1 fL (ref 80.0–100.0)
Platelets: 245 10*3/uL (ref 150–400)
RBC: 4.3 MIL/uL (ref 3.87–5.11)
RDW: 13.6 % (ref 11.5–15.5)
WBC: 13.3 10*3/uL — ABNORMAL HIGH (ref 4.0–10.5)
nRBC: 0 % (ref 0.0–0.2)

## 2022-07-24 LAB — POCT FERN TEST: POCT Fern Test: POSITIVE

## 2022-07-24 LAB — HIV ANTIBODY (ROUTINE TESTING W REFLEX): HIV Screen 4th Generation wRfx: NONREACTIVE

## 2022-07-24 LAB — ABO/RH: ABO/RH(D): O POS

## 2022-07-24 LAB — RPR: RPR Ser Ql: NONREACTIVE

## 2022-07-24 LAB — TYPE AND SCREEN
ABO/RH(D): O POS
Antibody Screen: NEGATIVE

## 2022-07-24 MED ORDER — FENTANYL CITRATE (PF) 100 MCG/2ML IJ SOLN: 100.0000 ug | INTRAMUSCULAR | Status: DC | PRN

## 2022-07-24 MED ORDER — SIMETHICONE 80 MG PO CHEW: 80.0000 mg | CHEWABLE_TABLET | ORAL | Status: DC | PRN

## 2022-07-24 MED ORDER — PRENATAL MULTIVITAMIN CH
1.0000 | ORAL_TABLET | Freq: Every day | ORAL | Status: DC
  Administered 2022-07-25: 1 via ORAL
  Filled 2022-07-24: qty 1

## 2022-07-24 MED ORDER — OXYCODONE-ACETAMINOPHEN 5-325 MG PO TABS: 2.0000 | ORAL_TABLET | ORAL | Status: DC | PRN

## 2022-07-24 MED ORDER — OXYCODONE-ACETAMINOPHEN 5-325 MG PO TABS: 1.0000 | ORAL_TABLET | ORAL | Status: DC | PRN

## 2022-07-24 MED ORDER — DIPHENHYDRAMINE HCL 25 MG PO CAPS: 25.0000 mg | ORAL_CAPSULE | Freq: Four times a day (QID) | ORAL | Status: DC | PRN

## 2022-07-24 MED ORDER — LACTATED RINGERS IV SOLN: 500.0000 mL | INTRAVENOUS | Status: DC | PRN

## 2022-07-24 MED ORDER — LACTATED RINGERS IV SOLN: INTRAVENOUS | Status: DC

## 2022-07-24 MED ORDER — TETANUS-DIPHTH-ACELL PERTUSSIS 5-2.5-18.5 LF-MCG/0.5 IM SUSY: 0.5000 mL | PREFILLED_SYRINGE | Freq: Once | INTRAMUSCULAR | Status: DC

## 2022-07-24 MED ORDER — LIDOCAINE HCL (PF) 1 % IJ SOLN
30.0000 mL | INTRAMUSCULAR | Status: AC | PRN
  Administered 2022-07-24: 30 mL via SUBCUTANEOUS
  Filled 2022-07-24: qty 30

## 2022-07-24 MED ORDER — ACETAMINOPHEN 325 MG PO TABS: 650.0000 mg | ORAL_TABLET | ORAL | Status: DC | PRN

## 2022-07-24 MED ORDER — SENNOSIDES-DOCUSATE SODIUM 8.6-50 MG PO TABS
2.0000 | ORAL_TABLET | Freq: Every day | ORAL | Status: DC
  Administered 2022-07-25: 2 via ORAL
  Filled 2022-07-24: qty 2

## 2022-07-24 MED ORDER — HYDROXYZINE HCL 50 MG PO TABS: 50.0000 mg | ORAL_TABLET | Freq: Four times a day (QID) | ORAL | Status: DC | PRN

## 2022-07-24 MED ORDER — IBUPROFEN 600 MG PO TABS
600.0000 mg | ORAL_TABLET | Freq: Four times a day (QID) | ORAL | Status: DC
  Administered 2022-07-24 – 2022-07-25 (×4): 600 mg via ORAL
  Filled 2022-07-24 (×4): qty 1

## 2022-07-24 MED ORDER — WITCH HAZEL-GLYCERIN EX PADS: 1.0000 | MEDICATED_PAD | CUTANEOUS | Status: DC | PRN

## 2022-07-24 MED ORDER — COCONUT OIL OIL: 1.0000 | TOPICAL_OIL | Status: DC | PRN

## 2022-07-24 MED ORDER — DIBUCAINE (PERIANAL) 1 % EX OINT: 1.0000 | TOPICAL_OINTMENT | CUTANEOUS | Status: DC | PRN

## 2022-07-24 MED ORDER — ONDANSETRON HCL 4 MG PO TABS: 4.0000 mg | ORAL_TABLET | ORAL | Status: DC | PRN

## 2022-07-24 MED ORDER — OXYTOCIN BOLUS FROM INFUSION
333.0000 mL | Freq: Once | INTRAVENOUS | Status: AC
  Administered 2022-07-24: 333 mL via INTRAVENOUS

## 2022-07-24 MED ORDER — BENZOCAINE-MENTHOL 20-0.5 % EX AERO
1.0000 | INHALATION_SPRAY | CUTANEOUS | Status: DC | PRN
  Administered 2022-07-24: 1 via TOPICAL
  Filled 2022-07-24: qty 56

## 2022-07-24 MED ORDER — ONDANSETRON HCL 4 MG/2ML IJ SOLN: 4.0000 mg | Freq: Four times a day (QID) | INTRAMUSCULAR | Status: DC | PRN

## 2022-07-24 MED ORDER — OXYTOCIN-SODIUM CHLORIDE 30-0.9 UT/500ML-% IV SOLN
2.5000 [IU]/h | INTRAVENOUS | Status: DC
  Filled 2022-07-24: qty 500

## 2022-07-24 MED ORDER — ACETAMINOPHEN 325 MG PO TABS
650.0000 mg | ORAL_TABLET | ORAL | Status: DC | PRN
  Administered 2022-07-24: 650 mg via ORAL
  Filled 2022-07-24: qty 2

## 2022-07-24 MED ORDER — SOD CITRATE-CITRIC ACID 500-334 MG/5ML PO SOLN: 30.0000 mL | ORAL | Status: DC | PRN

## 2022-07-24 MED ORDER — ONDANSETRON HCL 4 MG/2ML IJ SOLN: 4.0000 mg | INTRAMUSCULAR | Status: DC | PRN

## 2022-07-24 MED ORDER — FLEET ENEMA 7-19 GM/118ML RE ENEM: 1.0000 | ENEMA | RECTAL | Status: DC | PRN

## 2022-07-24 MED ORDER — ZOLPIDEM TARTRATE 5 MG PO TABS: 5.0000 mg | ORAL_TABLET | Freq: Every evening | ORAL | Status: DC | PRN

## 2022-07-24 NOTE — MAU Note (Signed)
...  CHAROLET MCKUSICK is a 37 y.o. at [redacted]w[redacted]d here in MAU reporting: CTX and LOF since 0930 this morning. Patient grunting and asking for an epidural. Upon SVE, patient found to be complete, 0 station, with a bulging bag. Initial FHR noted at 145. CTX palpating strong.  First Call L&D notified. Dr.Autry-Lott, MD, in the OR.  Cathie Beams, CNM, called and report given.  Isabel Caprice, RN, called L&D Charge Nurse and report given. Patient taken to room 207.   Dorathy Daft, CNM, accompanied this RN to transport patient to L&D.  GBS-.  Onset of complaint: one hour ago Pain score: 10/10 lower abdomen  FHT: 145 initial external Lab orders placed from triage:  MAU Labor Eval

## 2022-07-24 NOTE — Plan of Care (Signed)
Nazim Kadlec, RN 

## 2022-07-24 NOTE — H&P (Signed)
Monica Byrd is a 37 y.o. female [phone] with IUP at [redacted]w[redacted]d by 50 week [place] presenting for active labor.  She reports positive fetal movement. She denies leakage of fluid or vaginal bleeding.  Prenatal History/Complications: PNC at [facility] Preterm SVD 36 weeks D/T PTL, A1DM Late PNC  Pregnancy complications:  - Past Medical History: Past Medical History:  Diagnosis Date   Anxiety    Depression    Hypertension    pt denies    Past Surgical History: Past Surgical History:  Procedure Laterality Date   NO PAST SURGERIES      Obstetrical History: OB History     Gravida  4   Para  1   Term      Preterm  1   AB  2   Living  1      SAB      IAB  2   Ectopic      Multiple      Live Births  1            Social History: Social History   Socioeconomic History   Marital status: Single    Spouse name: Not on file   Number of children: Not on file   Years of education: Not on file   Highest education level: Not on file  Occupational History   Not on file  Tobacco Use   Smoking status: Never   Smokeless tobacco: Never  Vaping Use   Vaping Use: Never used  Substance and Sexual Activity   Alcohol use: Not Currently    Comment: 1-2 drinks occasionally    Drug use: Not Currently    Types: Marijuana    Comment: pt currently denies as of 10/10/17   Sexual activity: Not Currently  Other Topics Concern   Not on file  Social History Narrative   Not on file   Social Determinants of Health   Financial Resource Strain: Not on file  Food Insecurity: Unknown (07/24/2022)   Hunger Vital Sign    Worried About Running Out of Food in the Last Year: Never true    Ran Out of Food in the Last Year: Not on file  Transportation Needs: Not on file  Physical Activity: Not on file  Stress: Not on file  Social Connections: Not on file    Family History: Family History  Problem Relation Age of Onset   Pancreatic cancer Maternal Grandfather      Allergies: No Known Allergies  Medications Prior to Admission  Medication Sig Dispense Refill Last Dose   Blood Glucose Monitoring Suppl DEVI May substitute to any manufacturer covered by patient's insurance. 1 each 0    Glucose Blood (BLOOD GLUCOSE TEST STRIPS) STRP 1 each by In Vitro route in the morning, at noon, and at bedtime. Please check your blood sugar in the morning before eating anything.  Please check your blood sugar 2 hours after eating.  May substitute to any manufacturer covered by patient's insurance. 100 strip 0    Lancet Device MISC 1 each by Does not apply route in the morning, at noon, and at bedtime. May substitute to any manufacturer covered by patient's insurance. 1 each 0    Lancets Misc. MISC 1 each by Does not apply route in the morning, at noon, and at bedtime. May substitute to any manufacturer covered by patient's insurance. 100 each 0    metroNIDAZOLE (FLAGYL) 500 MG tablet Take 1 tablet (500 mg total) by mouth 2 (two)  times daily. 14 tablet 0    Prenatal Vit-Fe Fumarate-FA (PRENATAL MULTIVITAMIN) TABS tablet Take 1 tablet by mouth daily at 12 noon. 30 tablet 1     Review of Systems   Constitutional: Negative for fever and chills Eyes: Negative for visual disturbances Respiratory: Negative for shortness of breath, dyspnea Cardiovascular: Negative for chest pain or palpitations  Gastrointestinal: Negative for vomiting, diarrhea and constipation.  POSITIVE for abdominal pain (contractions) Genitourinary: Negative for dysuria and urgency Musculoskeletal: Negative for back pain, joint pain, myalgias  Neurological: Negative for dizziness and headaches  Blood pressure 127/81, pulse 96, resp. rate 16, height 5\' 4"  (1.626 m), last menstrual period 10/15/2021. General appearance: alert, cooperative, and no distress Lungs: normal respiratory effort Heart: regular rate and rhythm Abdomen: soft, non-tender; bowel sounds normal Extremities: Homans sign is  negative, no sign of DVT DTR's 2+ Presentation: cephalic Fetal monitoring  Baseline: 150s bpm, Variability: Good {> 6 bpm), Accelerations: none, and Decelerations: Variable: mild to moderate, return to baseline <60 seconds Uterine activity  q 2, strong Dilation: 10 Effacement (%): 100 Station: Plus 2 Exam by:: Cres- Dishmon, CNM   Prenatal labs: ABO, Rh: --/--/PENDING (05/21 1055) Antibody: PENDING (05/21 1055) Rubella: 5.13 (01/24 1214) RPR: Non Reactive (03/01 1636)  HBsAg: Negative (01/24 1214)  HIV: Non Reactive (03/01 1636)  GBS: Negative/-- (05/10 1637)   Nursing Staff Provider  Office Location  [facility]  Dating  27 wk [place] EDD 07/29/22  Language  English Anatomy US  normal  Flu Vaccine  N/a Genetic Screen  declined  TDaP vaccine   05/04/22 Hgb A1C or  GTT Third trimester   Rhogam  N/a   LAB RESULTS   Feeding Plan breast Blood Type O/Positive/-- (01/24 1214)   Contraception condoms Antibody Negative (01/24 1214)  Circumcision yes Rubella 5.13 (01/24 1214)  Pediatrician   Pediatrics RPR Non Reactive (03/01 1636)   Support Person  HBsAg Negative (01/24 1214)   Prenatal Classes  HIV Non Reactive (03/01 1636)  BTL Consent N/A GBS  (For PCN allergy, check sensitivities)   VBAC Consent N/A Pap ASCUS HPV neg- repeat 3 yrs  Resident PCP  Jagadish Hgb Electro  normal  COVID Vax  declined CF   Hep C Antibody  Neg SMA   Aspirin indicated  yes Waterbirth  Refer to [facility] -> [ ]  Class [ ]  Consent [ ]  CNM visit    Prenatal Transfer Tool  Maternal Diabetes: No Genetic Screening: Declined Maternal Ultrasounds/Referrals: Normal Fetal Ultrasounds or other Referrals:  None Maternal Substance Abuse:  No Significant Maternal Medications:  None Significant Maternal Lab Results: Group B Strep negative  Results for orders placed or performed during the hospital encounter of 07/24/22 (from the past 24 hour(s))  Fern Test   Collection Time: 07/24/22 10:45 AM  Result Value Ref Range   POCT  Fern Test Positive = ruptured amniotic membanes   CBC   Collection Time: 07/24/22 10:50 AM  Result Value Ref Range   WBC 13.3 (H) 4.0 - 10.5 K/uL   RBC 4.30 3.87 - 5.11 MIL/uL   Hemoglobin 13.3 12.0 - 15.0 g/dL   HCT 16.1 09.6 - 04.5 %   MCV 92.1 80.0 - 100.0 fL   MCH 30.9 26.0 - 34.0 pg   MCHC 33.6 30.0 - 36.0 g/dL   RDW 40.9 81.1 - 91.4 %   Platelets 245 150 - 400 K/uL   nRBC 0.0 0.0 - 0.2 %  Type and screen [facility]  Collection Time: 07/24/22 10:55 AM  Result Value Ref Range   ABO/RH(D) PENDING    Antibody Screen PENDING    Sample Expiration      07/27/2022,2359 Performed at [facility], [address], [place], [place] [address]     Assessment: AKOSUA CARDIEL is a 37 y.o. [phone] with an IUP at [redacted]w[redacted]d presenting for active labor  Plan: #Labor: plan to push d/t decels #Pain:  Per request #FWB Cat 2 #ID: GBS: neg    Jacklyn Shell 07/24/2022, 12:03 PM

## 2022-07-24 NOTE — Lactation Note (Signed)
This note was copied from a baby's chart. Lactation Consultation Note  Patient Name: Monica Byrd Today's Date: 07/24/2022 Age:35 hours Reason for consult: Initial assessment;Term;Breastfeeding assistance  The infant was at 3 hours old.  LC entered the room and the infant was asleep in the bassinet. The birth parent stated that the infant had been to the breast once since delivery.  She said that she was not having any pain with latching.  The birth parent tried to breastfeed her older child and was unsuccessful due to nipple pain.  The birth parent really wants to breastfeed this time.  The birth parent is aware that breastfeeding should not be painful.  RN Lequita Halt reviewed feeding frequency, milk production, hand expression, and outpatient services.  All questions were answered.  The birth parent said that she would call for assistance with breastfeeding if needed.   Infant Feeding Plan:  Breastfeed 8+ times in 24 hours according to feeding cues.  Hand express and feed the expressed milk to the infant via a spoon.  Call RN/LC for assistance with breastfeeding.   Maternal Data Has patient been taught Hand Expression?: Yes Does the patient have breastfeeding experience prior to this delivery?: Yes How long did the patient breastfeed?: She attempted with her older child and was not successful due to nipple tenderness  Feeding Mother's Current Feeding Choice: Breast Milk and Formula  LATCH Score Latch: Grasps breast easily, tongue down, lips flanged, rhythmical sucking.  Audible Swallowing: Spontaneous and intermittent  Type of Nipple: Everted at rest and after stimulation  Comfort (Breast/Nipple): Soft / non-tender  Hold (Positioning): Assistance needed to correctly position infant at breast and maintain latch.  LATCH Score: 9  Interventions Interventions: Education;Breast feeding basics reviewed  Discharge Pump: Personal;DEBP  Consult Status Consult Status:  Follow-up Date: 07/25/22 Follow-up type: In-patient   Delene Loll 07/24/2022, 2:34 PM

## 2022-07-24 NOTE — Telephone Encounter (Signed)
Just got of the phone with Monica Byrd. she stated that her contractions are getting stronger, [facility] she wanted to know if you had to call or do anything.

## 2022-07-24 NOTE — Telephone Encounter (Signed)
-----   Message from Levin Erp, MD sent at 07/21/2022  7:58 AM EDT ----- Can we make sure this patient is seen this week for ob follow up?

## 2022-07-25 LAB — GLUCOSE, CAPILLARY: Glucose-Capillary: 87 mg/dL (ref 70–99)

## 2022-07-25 MED ORDER — IBUPROFEN 600 MG PO TABS: 600.0000 mg | ORAL_TABLET | Freq: Three times a day (TID) | ORAL | 0 refills | Status: AC | PRN

## 2022-07-25 NOTE — Discharge Summary (Signed)
Postpartum Discharge Summary    Patient Name: Monica Byrd DOB: Oct 19, 1985 MRN: [id number]  Date of admission: 07/24/2022 Delivery date:07/24/2022  Delivering provider: Jacklyn Shell  Date of discharge: 07/25/2022  Admitting diagnosis: Indication for care in labor or delivery [O75.9] Intrauterine pregnancy: [redacted]w[redacted]d     Secondary diagnosis:  Principal Problem:   Vaginal delivery Active Problems:   Supervision of normal pregnancy in second trimester  Additional problems: no    Discharge diagnosis: Term Pregnancy Delivered                                              Post partum procedures: none Augmentation:  none Complications: None  Hospital course: Onset of Labor With Vaginal Delivery      37 y.o. yo [id number] at [redacted]w[redacted]d was admitted in Active Labor on 07/24/2022. Labor course was complicated by: none  Membrane Rupture Time/Date: 9:45 AM ,07/24/2022   Delivery Method:Vaginal, Spontaneous  Episiotomy: None  Lacerations:  2nd degree  Patient had a postpartum course complicated by : none.  She had 2 elevated blood pressure readings noted within 2-3 hrs post delivery, likely due to pain. Prior to that and her entire post partum period, blood pressures have been normal. She is ambulating, tolerating a regular diet, passing flatus, and urinating well. Patient is discharged home in stable condition on 07/25/22.  Newborn Data: Birth date:07/24/2022  Birth time:11:08 AM  Gender:Female  Living status:Living  Apgars:7 ,9  Weight:3270 g   Magnesium Sulfate received: No BMZ received: No Rhophylac:N/A MMR:N/A T-DaP:Given prenatally Flu: N/A Transfusion:No  Physical exam  Vitals:   07/24/22 1739 07/24/22 1847 07/24/22 2251 07/25/22 0302  BP: 123/81 113/78 129/79 133/80  Pulse: 78 90 92 80  Resp:  16 17 18   Temp:  98.1 F (36.7 C) 98.7 F (37.1 C) 98.8 F (37.1 C)  TempSrc:  Oral  Oral  SpO2:  100% 99% 99%  Height:       General: alert, cooperative, and no  distress Lochia: appropriate Uterine Fundus: firm Incision: N/A DVT Evaluation: No evidence of DVT seen on physical exam. No significant calf/ankle edema. Labs: Lab Results  Component Value Date   WBC 13.3 (H) 07/24/2022   HGB 13.3 07/24/2022   HCT 39.6 07/24/2022   MCV 92.1 07/24/2022   PLT 245 07/24/2022      Latest Ref Rng & Units 05/04/2022    4:36 PM  CMP  Glucose 70 - 99 mg/dL 74   BUN 6 - 20 mg/dL 11   Creatinine 7.84 - 1.00 mg/dL 6.96   Sodium 295 - 284 mmol/L 139   Potassium 3.5 - 5.2 mmol/L 4.3   Chloride 96 - 106 mmol/L 104   CO2 20 - 29 mmol/L 19   Calcium 8.7 - 10.2 mg/dL 9.1    Edinburgh Score:    07/24/2022    1:38 PM  Edinburgh Postnatal Depression Scale Screening Tool  I have been able to laugh and see the funny side of things. 0  I have looked forward with enjoyment to things. 0  I have blamed myself unnecessarily when things went wrong. 0  I have been anxious or worried for no good reason. 2  I have felt scared or panicky for no good reason. 0  Things have been getting on top of me. 0  I have been so unhappy that I  have had difficulty sleeping. 0  I have felt sad or miserable. 0  I have been so unhappy that I have been crying. 0  The thought of harming myself has occurred to me. 0  Edinburgh Postnatal Depression Scale Total 2     After visit meds:  Allergies as of 07/25/2022   No Known Allergies      Medication List     STOP taking these medications    Blood Glucose Monitoring Suppl Devi   BLOOD GLUCOSE TEST STRIPS Strp   Lancet Device Misc   Lancets Misc. Misc   metroNIDAZOLE 500 MG tablet Commonly known as: FLAGYL       TAKE these medications    ibuprofen 600 MG tablet Commonly known as: ADVIL Take 1 tablet (600 mg total) by mouth every 8 (eight) hours as needed. Take with meals   prenatal multivitamin Tabs tablet Take 1 tablet by mouth daily at 12 noon.         Discharge home in stable condition Infant Feeding:  Breast Infant Disposition:home with mother Discharge instruction: per After Visit Summary and Postpartum booklet. Activity: Advance as tolerated. Pelvic rest for 6 weeks.  Diet: routine diet Future Appointments: Future Appointments  Date Time Provider Department Center  08/01/2022 11:10 AM Levin Erp, MD [facility]   Follow up Visit:  The following message was sent to [facility] by Gwenyth Allegra, MD  Please schedule this patient for a In person postpartum visit in 6 weeks with the following provider: Any provider. Additional Postpartum F/U: none   Low risk pregnancy complicated by:  None Delivery mode:  Vaginal, Spontaneous  Anticipated Birth Control:  Condoms  Sheppard Evens MD MPH OB Fellow, Faculty Practice [facility], Center for [facility] 07/25/2022

## 2022-07-31 NOTE — Progress Notes (Deleted)
  [facility] Postpartum Visit   Monica Byrd is a 37 y.o. [id number] presenting for a postpartum visit.  She has the following concerns today: *** She delivered via *** at ***.  She reports her vaginal bleeding is ***. She is *** feeding her infant. She feels she is bonding well. She is considering *** for contraception.   Edinburgh Postnatal Depression Scale: *** (10 or higher is positive)  Reviewed pregnancy and delivery course ***.  -  There were no vitals filed for this visit. Exam: *** Pelvic exam: ***   A/P:  Postpartum visit: patient is *** weeks postpartum following a *** delivery. -Discussed patients delivery*** and complications -Patient had a *** degree laceration, perineal healing reviewed. Patient expressed understanding -Patient has urinary incontinence? {yes/no:20286}*** Patient was referred to pelvic floor PT  -Patient {ACTION; IS/IS JYN:[id number]} safe to resume physical and sexual activity -Patient {DOES_DOES YQM:57846} want a pregnancy in the next year.  Desired family size is {NUMBER 1-10:22536} children.  -Reviewed forms of contraception in tiered fashion. Patient desired {PLAN CONTRACEPTION:313102} today.   -Return to sexual activity and contraception discussed as above.  -Discussed birth spacing of 18 months -Breastfeeding: {yes/no:20286}, provided support of decision and resources as indicated  -Mood: ***  -Discussed sleep and fatigue management and encouraged family/community support.  -Postpartum vaccines: *** -Need for postpartum diabetes screening:  *** (2 hour glucose tolerance testing between 6-12 weeks postpartum)  -Reviewed prior Pap and she is next due for Pap ***.  -Return in No follow-ups on file.

## 2022-08-01 ENCOUNTER — Telehealth (HOSPITAL_COMMUNITY): Payer: Self-pay | Admitting: *Deleted

## 2022-08-01 ENCOUNTER — Ambulatory Visit: Payer: Managed Care, Other (non HMO) | Admitting: Student

## 2022-08-01 NOTE — Telephone Encounter (Signed)
Attempted hospital discharge follow-up call. Left message for patient to return RN call with any questions or concerns. Deforest Hoyles, RN, 08/01/22, [phone]

## 2022-08-20 ENCOUNTER — Ambulatory Visit (INDEPENDENT_AMBULATORY_CARE_PROVIDER_SITE_OTHER): Payer: Managed Care, Other (non HMO) | Admitting: Family Medicine

## 2022-08-20 ENCOUNTER — Ambulatory Visit: Payer: Managed Care, Other (non HMO) | Admitting: Student

## 2022-08-20 NOTE — Progress Notes (Signed)
  [facility] Postpartum Visit   Monica Byrd is a 37 y.o. [id number] presenting for a postpartum visit.  She has the following concerns today: none She delivered on 07/24/2022 via vaginal delivery at [redacted]w[redacted]d. She reports her vaginal bleeding has subsided. She is breast and formula feeding her infant. She feels she is bonding well. She is considering condoms for contraception.   Edinburgh Postnatal Depression Scale: 2 Reviewed pregnancy and delivery course. Pregnancy complicated by: late PNC, +chlamydia (treated, negative TOC), elevated 1hr GTT (normal 3hr), h/o mood disorder  Delivery complications: 2nd degree lac  Vitals:   08/20/22 1510  BP: 124/80  Pulse: 91  SpO2: 100%   Exam:  Gen: NAD, pleasant, able to participate in exam CV: RRR, normal S1/S2, no murmur Resp: Normal effort, lungs CTAB GI: abd soft, nontender Extremities: no edema or cyanosis Skin: warm and dry, no rashes noted Neuro: alert, no obvious focal deficits Psych: Normal affect and mood Pelvic exam: offered, patient declined   A/P:  Postpartum visit: patient is 4 weeks postpartum following a vaginal delivery. -Discussed patients delivery and complications -Patient had a 2nd degree laceration, perineal healing reviewed. Patient expressed understanding -Patient has urinary incontinence? No  -Patient is safe to resume physical and sexual activity -Patient does not want a pregnancy in the next year.  -Reviewed forms of contraception in tiered fashion. Patient desired abstinence for now, followed by condoms -Return to sexual activity and contraception discussed as above.  -Discussed birth spacing of 18 months -Breastfeeding: Yes, provided support of decision and resources as indicated  -Mood: no concerns -Discussed sleep and fatigue management and encouraged family/community support.  -Need for postpartum diabetes screening: not indicated -Reviewed prior Pap and she is next due for Pap in 2027.

## 2022-08-20 NOTE — Patient Instructions (Addendum)
It was great to meet you!  Congratulations on your beautiful baby!!  Bedsider.org is an excellent website to learn more about the various contraception options. Let us know if you decide you're interested in any of them.  [facility] has different support groups for new moms-- TransmissionCleaner.no to learn more. [employer] is another website with good information.  Let us know if you need anything. Good luck with your transition back to work!  Take care, Dr Anner Crete

## 2022-08-20 NOTE — Progress Notes (Signed)
Patient is wanting a note for work stating any restriction (if there is any).

## 2022-08-27 ENCOUNTER — Telehealth: Payer: Self-pay | Admitting: Student

## 2022-08-27 NOTE — Telephone Encounter (Signed)
Letter updated and sent to patient via MyChart.   Maury Dus, MD PGY-3, [facility]

## 2022-08-27 NOTE — Telephone Encounter (Signed)
Patient calls regarding the doctors note she received on June 17th to return to work. Her HR called and said they have a specific wording the note must say for them to take it. It needs to say:  It is my medical opinion that Monica Byrd is fit for duty and may return to work with no restrictions.  Can we get this updated for her and sent to her MyChart so she can print it out herself?  Routing to Dr Anner Crete since she is the doctor who saw her for this encounter.  Thanks!

## 2022-10-19 ENCOUNTER — Other Ambulatory Visit: Payer: Self-pay | Admitting: Student

## 2022-12-08 IMAGING — CT CT MAXILLOFACIAL W/O CM
3 series · 15 of 47 positions shown, 18 images · non-contrast
Comparison: 07/02/2008

CLINICAL DATA: Blunt facial trauma with headaches, initial
encounter

EXAM:
CT HEAD WITHOUT CONTRAST
CT MAXILLOFACIAL WITHOUT CONTRAST
TECHNIQUE: Multidetector CT imaging of the head and maxillofacial structures
were performed using the standard protocol without intravenous
contrast. Multiplanar CT image reconstructions of the maxillofacial
structures were also generated.
RADIATION DOSE REDUCTION: This exam was performed according to the
departmental dose-optimization program which includes automated
exposure control, adjustment of the mA and/or kV according to
patient size and/or use of iterative reconstruction technique.

[Series 2: max soft · axial · 0.33mm/px · z∈[+1238,+1388]mm · 9 of 87 slices shown, 12 images]
[im 6/87  brain]
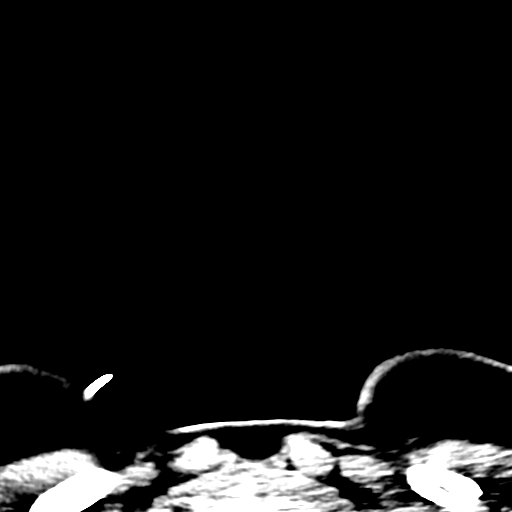
[im 6/87  bone]
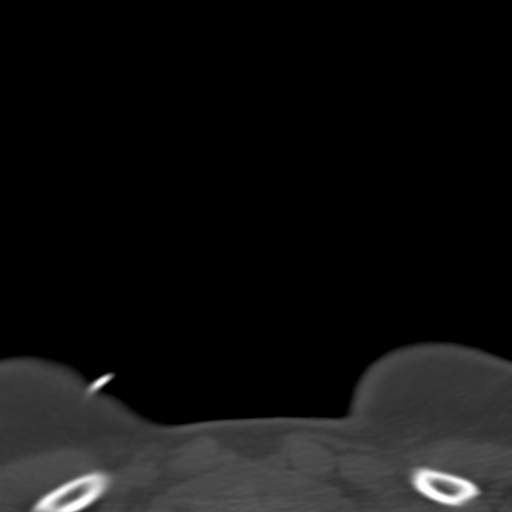
[im 15/87  bone]
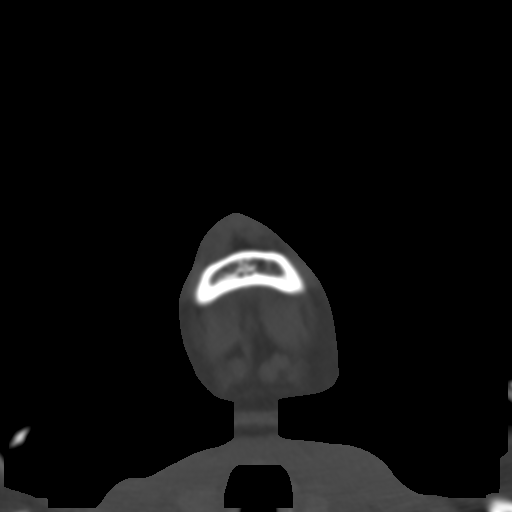
[im 24/87  bone]
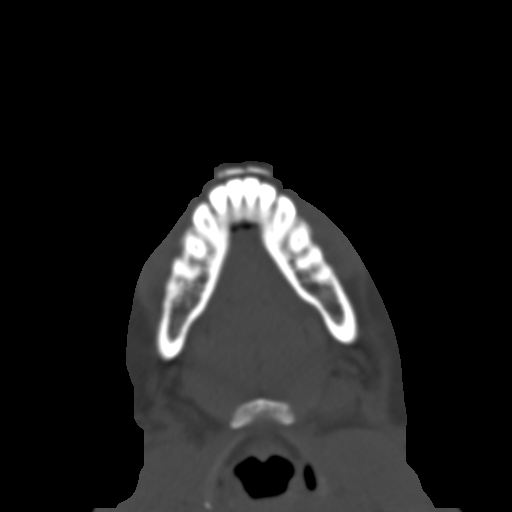
[im 33/87  bone]
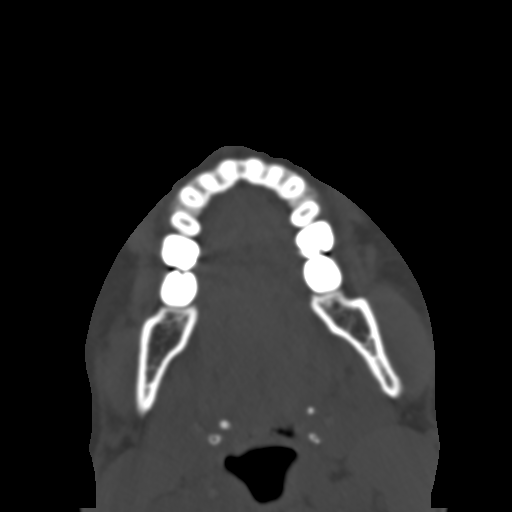
[im 45/87  brain]
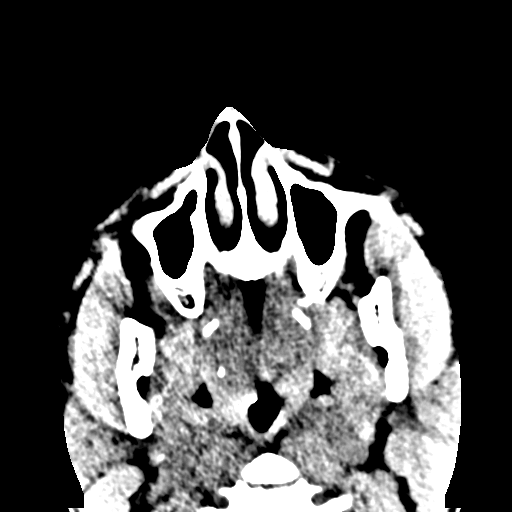
[im 45/87  bone]
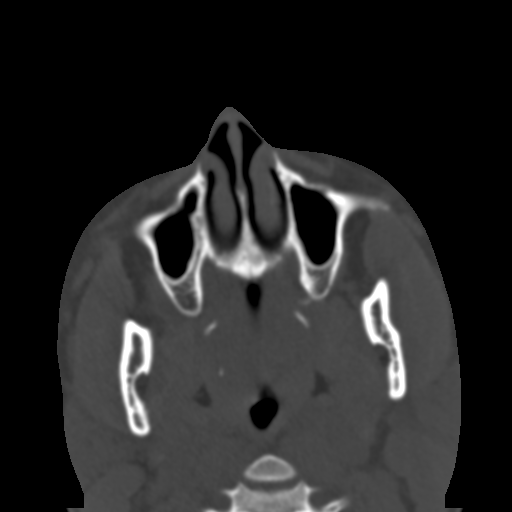
[im 54/87  bone]
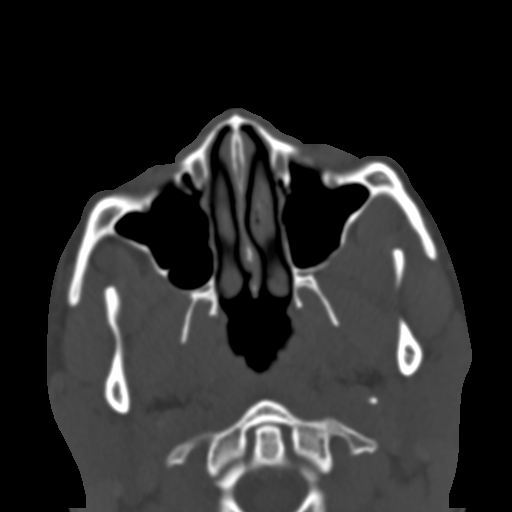
[im 63/87  bone]
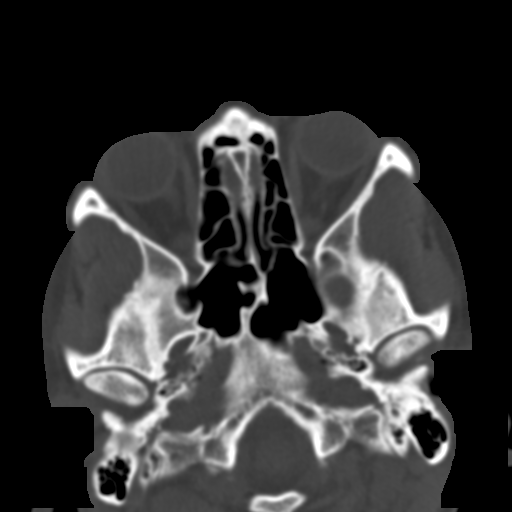
[im 72/87  bone]
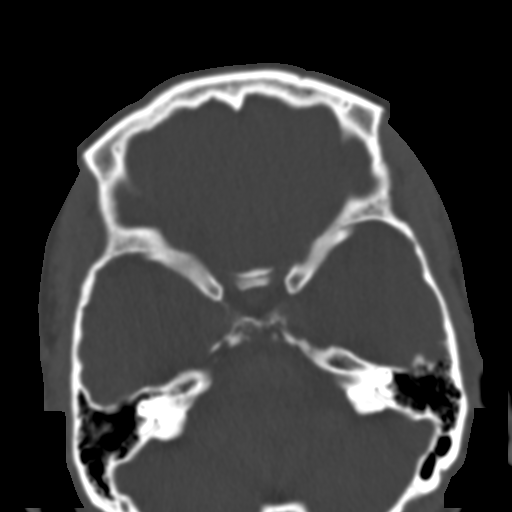
[im 81/87  brain]
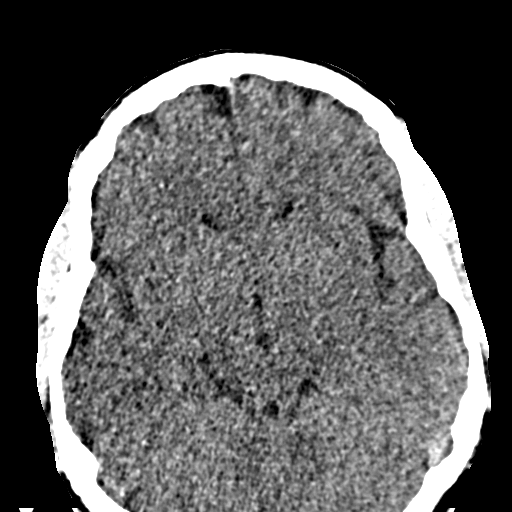
[im 81/87  bone]
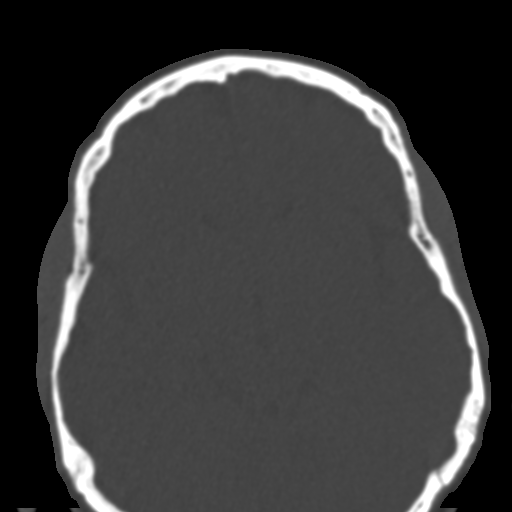

[Series 6: coronal soft · coronal · 0.36mm/px · 3 of 71 slices shown]
[im 24/71  bone]
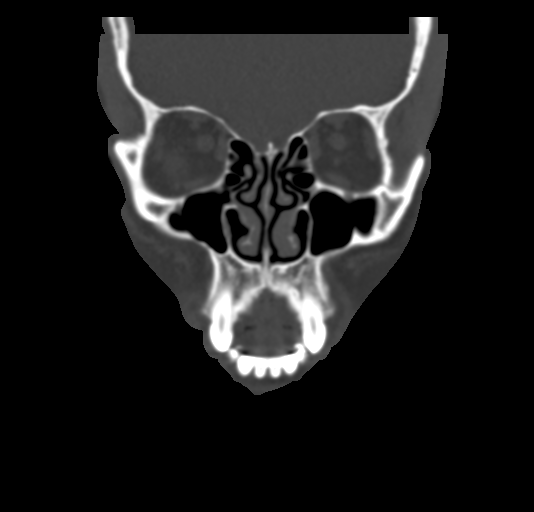
[im 32/71  bone]
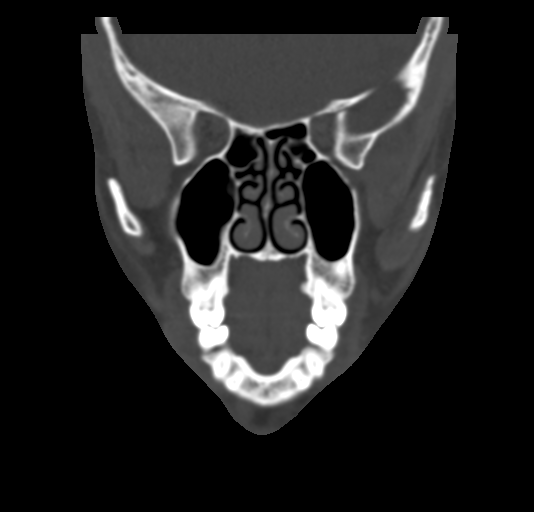
[im 39/71  bone]
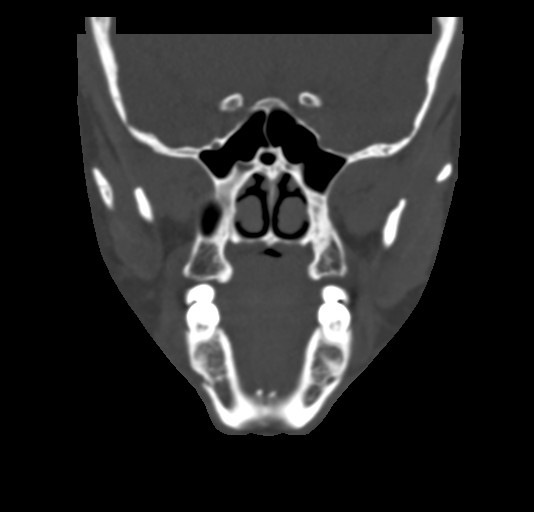

[Series 7: sagittal soft · sagittal · 0.31mm/px · 3 of 77 slices shown]
[im 26/77  bone]
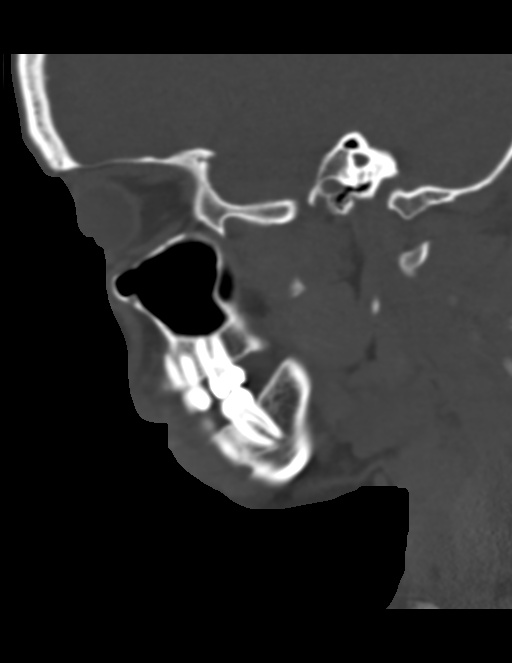
[im 39/77  bone]
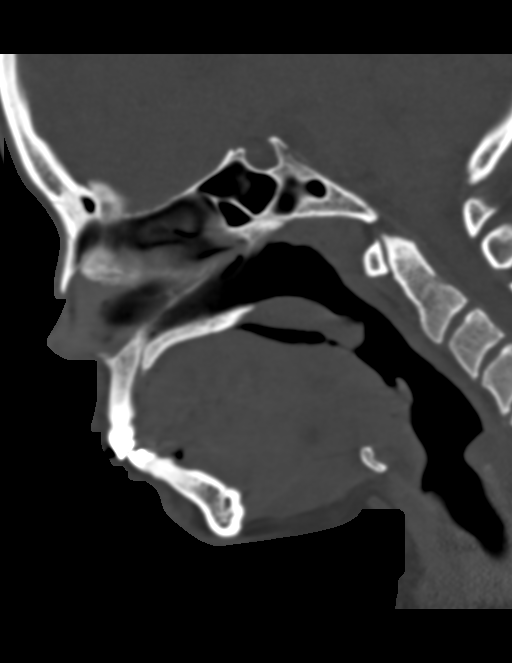
[im 51/77  bone]
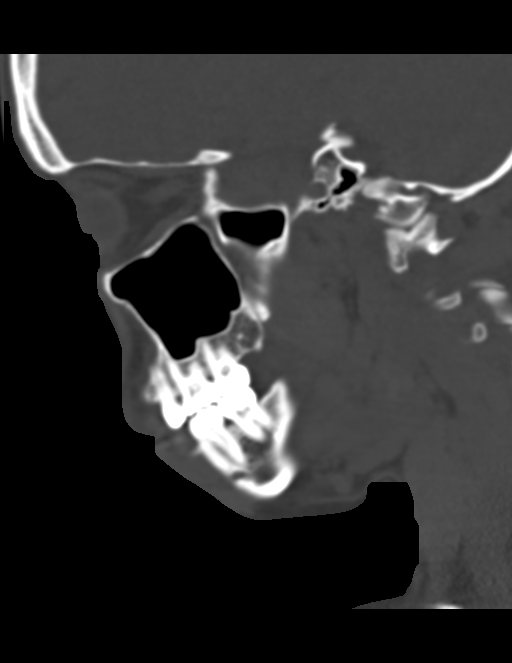

[15 of 47 positions shown; findings below may reference images not displayed]

FINDINGS: CT HEAD FINDINGS

Brain: No evidence of acute infarction, hemorrhage, hydrocephalus,
extra-axial collection or mass lesion/mass effect.

Vascular: No hyperdense vessel or unexpected calcification.

Skull: Normal. Negative for fracture or focal lesion.

Other: None.

CT MAXILLOFACIAL FINDINGS

Osseous: No acute bony abnormality is noted.

Orbits: Orbits and their contents are within normal limits.

Sinuses: Paranasal sinuses are well aerated without focal
abnormality.

Soft tissues: Surrounding soft tissue structures are within normal
limits.
IMPRESSION: CT of the head: No acute intracranial abnormality noted.

CT of the maxillofacial bones: No acute abnormality noted.

## 2023-06-06 ENCOUNTER — Ambulatory Visit

## 2023-06-06 NOTE — Progress Notes (Deleted)
    SUBJECTIVE:   CHIEF COMPLAINT / HPI:   Discussed the use of AI scribe software for clinical note transcription with the patient, who gave verbal consent to proceed.  History of Present Illness    PERTINENT  PMH / PSH: ***  OBJECTIVE:   There were no vitals taken for this visit.  ***  ASSESSMENT/PLAN:   Assessment & Plan    Assessment and Plan Assessment & Plan    Levin Erp, MD [facility]

## 2023-06-07 ENCOUNTER — Ambulatory Visit: Admitting: Student

## 2023-06-13 ENCOUNTER — Ambulatory Visit: Admitting: Family Medicine

## 2023-06-13 NOTE — Progress Notes (Deleted)
   SUBJECTIVE:   CHIEF COMPLAINT / HPI:  Monica Byrd is a 38 y.o. female with a pertinent past medical history of *** presenting to the clinic for   Abdominal cramping, back pain   PERTINENT PMH / PSH: ***   OBJECTIVE:   There were no vitals taken for this visit. Physical Exam General: Age-appropriate, resting comfortably in chair, NAD, alert and at baseline. HEENT:  Head: Normocephalic, atraumatic. No tenderness to percussion over sinuses. Eyes: PERRLA. No conjunctival erythema or scleral injections. Ears: TMs non-bulging and non-erythematous bilaterally. No erythema of external ear canal. No cerumen impaction. Nose: Non-erythematous turbinates. No rhinorrhea. Mouth/Oral: Clear, no tonsillar exudate. MMM. Neck: Supple. No LAD. Cardiovascular: Regular rate and rhythm. Normal S1/S2. No murmurs, rubs, or gallops appreciated. 2+ radial pulses. Pulmonary: Clear bilaterally to ascultation. No wheezes, crackles, or rhonchi. Normal WOB on room air. No accessory muscle use. Abdominal: No tenderness to deep or light palpation. No rebound or guarding. No HSM. Skin: Warm and dry. Extremities: No peripheral edema bilaterally. Capillary refill <2 seconds.  No results found for this or any previous visit (from the past 48 hours).     08/20/2022    3:12 PM  Depression screen PHQ 2/9  Decreased Interest 0  Down, Depressed, Hopeless 0  PHQ - 2 Score 0  Altered sleeping 0  Tired, decreased energy 0  Change in appetite 0  Feeling bad or failure about yourself  0  Trouble concentrating 0  Moving slowly or fidgety/restless 0  Suicidal thoughts 0  PHQ-9 Score 0     ASSESSMENT/PLAN:   Assessment & Plan   No follow-ups on file.  Ehab Humber Sharion Dove, MD [facility]

## 2023-08-13 ENCOUNTER — Encounter: Payer: Self-pay | Admitting: *Deleted

## 2023-08-16 ENCOUNTER — Ambulatory Visit (INDEPENDENT_AMBULATORY_CARE_PROVIDER_SITE_OTHER): Admitting: Student

## 2023-08-16 ENCOUNTER — Encounter: Payer: Self-pay | Admitting: Student

## 2023-08-16 VITALS — BP 134/83 | HR 82 | Ht 64.0 in | Wt 217.6 lb

## 2023-08-16 DIAGNOSIS — M25571 Pain in right ankle and joints of right foot: Secondary | ICD-10-CM | POA: Insufficient documentation

## 2023-08-16 MED ORDER — NAPROXEN 500 MG PO TABS
500.0000 mg | ORAL_TABLET | Freq: Two times a day (BID) | ORAL | 0 refills | Status: AC
Start: 2023-08-16 — End: ?

## 2023-08-16 NOTE — Patient Instructions (Addendum)
 It was great to see you! Thank you for allowing me to participate in your care!    Our plans for today:  - I referred you to sports medicine for further evaluation of your ankle pain as they can do ultrasound if needed.  - You can apply over the counter Voltaren gel up to four times daily and a prescription for naproxen has been sent to your pharmacy to take as needed for pain. Take this with a meal up to twice daily and do not take with other NSAIDs such as ibuprofen /motrin /advil , aleve (brand name of naproxen) - You should NOT take NSAID medications (including naproxen) during pregnancy and instead it is safe to take Tylenol    Take care and seek immediate care sooner if you develop any concerns.   Dr. Glenn Lange, DO [facility]

## 2023-08-16 NOTE — Progress Notes (Signed)
    SUBJECTIVE:   CHIEF COMPLAINT / HPI:   Monica Byrd is a 38 year old female who presents with right ankle pain   She has experienced significant pain in her right ankle for over a month, localized posterior to the medial malleolus. The pain is worse in the mornings, improves throughout the day but worsens with prolonged standing or pressure. Her job requires constant standing, which exacerbates the pain. She recalls breaking the same ankle three to four years ago. She has used Geophysical data processor inserts and a topical roller with methanol for temporary relief.  She tries to avoid taking pain medication if possible but states the pain is sometimes bad enough that she might need some.  PERTINENT  PMH / PSH: None pertinent  OBJECTIVE:   BP 134/83   Pulse 82   Ht 5' 4 (1.626 m)   Wt 217 lb 9.6 oz (98.7 kg)   LMP 07/28/2023   SpO2 97%   Breastfeeding No   BMI 37.35 kg/m    General: NAD, pleasant, well-appearing Cardiac: Well-perfused Respiratory: Normal effort MSK: Very mild swelling of medial right ankle without erythema, warmth or deformity.  Mild only TTP to right posterior aspect of medial malleolus, 5/5 muscle strength with eversion, inversion, dorsiflexion and plantarflexion without pain.  Anterior drawer test negative, Thompson test negative, no pain with palpation of the Achilles.  Able to stand and walk without difficulty   ASSESSMENT/PLAN:   Right ankle pain Chronic pain posterior to R medial malleolus.  Unlikely to be a fracture given no recent injury and she is able to apply pressure.  Considered arthritis due to history of fracture years ago.  Also considered posterior tibial tendinitis which is more likely. Ultrasound preferred for tendinopathy assessment. - Refer to sports medicine for evaluation and ankle ultrasound. - Prescribed naproxen 500 mg twice daily for pain if needed, advised to avoid if pregnancy suspected, take with food.  - Can also take OTC Tylenol   as needed - Recommended Voltaren gel up to four times daily for localized pain. - Instructed to avoid naproxen with other NSAIDs.     Dr. Glenn Lange, DO [facility]

## 2023-08-16 NOTE — Assessment & Plan Note (Signed)
 Chronic pain posterior to R medial malleolus.  Unlikely to be a fracture given no recent injury and she is able to apply pressure.  Considered arthritis due to history of fracture years ago.  Also considered posterior tibial tendinitis which is more likely. Ultrasound preferred for tendinopathy assessment. - Refer to sports medicine for evaluation and ankle ultrasound. - Prescribed naproxen 500 mg twice daily for pain if needed, advised to avoid if pregnancy suspected, take with food.  - Can also take OTC Tylenol  as needed - Recommended Voltaren gel up to four times daily for localized pain. - Instructed to avoid naproxen with other NSAIDs.

## 2023-09-19 ENCOUNTER — Ambulatory Visit: Admitting: Family Medicine

## 2023-09-30 ENCOUNTER — Ambulatory Visit: Admitting: Family Medicine
